# Patient Record
Sex: Male | Born: 1935 | Race: White | Hispanic: No
Health system: Southern US, Community
[De-identification: ages and names within clinical notes are randomized; demographics above are authoritative.]

## PROBLEM LIST (undated history)

## (undated) DIAGNOSIS — F0390 Unspecified dementia without behavioral disturbance: Secondary | ICD-10-CM

## (undated) DIAGNOSIS — I10 Essential (primary) hypertension: Secondary | ICD-10-CM

## (undated) DIAGNOSIS — E785 Hyperlipidemia, unspecified: Secondary | ICD-10-CM

## (undated) DIAGNOSIS — Z8673 Personal history of transient ischemic attack (TIA), and cerebral infarction without residual deficits: Secondary | ICD-10-CM

## (undated) HISTORY — PX: CATARACT EXTRACTION W/ INTRAOCULAR LENS IMPLANT: SHX1309

## (undated) HISTORY — PX: TRANSURETHRAL RESECTION OF PROSTATE: SHX73

---

## 1998-03-09 ENCOUNTER — Ambulatory Visit (HOSPITAL_COMMUNITY): Admission: RE | Admit: 1998-03-09 | Discharge: 1998-03-09 | Payer: Self-pay | Admitting: Specialist

## 1998-05-27 ENCOUNTER — Ambulatory Visit (HOSPITAL_COMMUNITY): Admission: RE | Admit: 1998-05-27 | Discharge: 1998-05-27 | Payer: Self-pay | Admitting: Specialist

## 2001-05-02 ENCOUNTER — Encounter: Payer: Self-pay | Admitting: Emergency Medicine

## 2001-05-02 ENCOUNTER — Emergency Department (HOSPITAL_COMMUNITY): Admission: EM | Admit: 2001-05-02 | Discharge: 2001-05-02 | Payer: Self-pay | Admitting: Emergency Medicine

## 2001-07-20 ENCOUNTER — Encounter: Admission: RE | Admit: 2001-07-20 | Discharge: 2001-10-18 | Payer: Self-pay | Admitting: Internal Medicine

## 2001-09-18 ENCOUNTER — Encounter: Admission: RE | Admit: 2001-09-18 | Discharge: 2001-12-17 | Payer: Self-pay | Admitting: Internal Medicine

## 2001-10-09 ENCOUNTER — Encounter: Admission: RE | Admit: 2001-10-09 | Discharge: 2001-11-08 | Payer: Self-pay | Admitting: Internal Medicine

## 2004-01-24 ENCOUNTER — Observation Stay (HOSPITAL_COMMUNITY): Admission: EM | Admit: 2004-01-24 | Discharge: 2004-01-25 | Payer: Self-pay | Admitting: Emergency Medicine

## 2004-03-19 ENCOUNTER — Emergency Department (HOSPITAL_COMMUNITY): Admission: EM | Admit: 2004-03-19 | Discharge: 2004-03-19 | Payer: Self-pay | Admitting: *Deleted

## 2005-02-11 ENCOUNTER — Encounter (INDEPENDENT_AMBULATORY_CARE_PROVIDER_SITE_OTHER): Payer: Self-pay | Admitting: Specialist

## 2005-02-11 ENCOUNTER — Inpatient Hospital Stay (HOSPITAL_COMMUNITY): Admission: RE | Admit: 2005-02-11 | Discharge: 2005-02-14 | Payer: Self-pay | Admitting: Urology

## 2008-12-24 ENCOUNTER — Emergency Department (HOSPITAL_COMMUNITY): Admission: EM | Admit: 2008-12-24 | Discharge: 2008-12-24 | Payer: Self-pay | Admitting: Emergency Medicine

## 2010-12-18 ENCOUNTER — Emergency Department (HOSPITAL_COMMUNITY): Admission: EM | Admit: 2010-12-18 | Discharge: 2010-12-18 | Source: Home / Self Care | Admitting: Emergency Medicine

## 2010-12-18 LAB — DIFFERENTIAL
Basophils Relative: 0 % (ref 0–1)
Eosinophils Relative: 2 % (ref 0–5)
Lymphocytes Relative: 30 % (ref 12–46)
Lymphs Abs: 2 10*3/uL (ref 0.7–4.0)
Neutro Abs: 3.8 10*3/uL (ref 1.7–7.7)

## 2010-12-18 LAB — CBC
HCT: 34 % — ABNORMAL LOW (ref 39.0–52.0)
MCH: 30.1 pg (ref 26.0–34.0)
MCV: 90.4 fL (ref 78.0–100.0)
Platelets: 228 10*3/uL (ref 150–400)
RBC: 3.76 MIL/uL — ABNORMAL LOW (ref 4.22–5.81)
WBC: 6.7 10*3/uL (ref 4.0–10.5)

## 2010-12-18 LAB — BASIC METABOLIC PANEL
BUN: 32 mg/dL — ABNORMAL HIGH (ref 6–23)
CO2: 27 mEq/L (ref 19–32)
Calcium: 9.1 mg/dL (ref 8.4–10.5)
Creatinine, Ser: 1.64 mg/dL — ABNORMAL HIGH (ref 0.4–1.5)
GFR calc non Af Amer: 40 mL/min — ABNORMAL LOW (ref >60)
Glucose, Bld: 177 mg/dL — ABNORMAL HIGH (ref 70–99)
Sodium: 135 mEq/L (ref 135–145)

## 2011-03-08 LAB — URINALYSIS, ROUTINE W REFLEX MICROSCOPIC
Bilirubin Urine: NEGATIVE
Nitrite: NEGATIVE
Specific Gravity, Urine: 1.016 (ref 1.005–1.030)
pH: 7 (ref 5.0–8.0)

## 2011-03-08 LAB — COMPREHENSIVE METABOLIC PANEL
AST: 31 U/L (ref 0–37)
Albumin: 4.5 g/dL (ref 3.5–5.2)
Alkaline Phosphatase: 123 U/L — ABNORMAL HIGH (ref 39–117)
Chloride: 102 mEq/L (ref 96–112)
Potassium: 3.7 mEq/L (ref 3.5–5.1)
Sodium: 140 mEq/L (ref 135–145)
Total Bilirubin: 1 mg/dL (ref 0.3–1.2)

## 2011-03-08 LAB — DIFFERENTIAL
Eosinophils Absolute: 0 10*3/uL (ref 0.0–0.7)
Eosinophils Relative: 1 % (ref 0–5)
Lymphs Abs: 1.6 10*3/uL (ref 0.7–4.0)
Monocytes Absolute: 0.6 10*3/uL (ref 0.1–1.0)
Monocytes Relative: 10 % (ref 3–12)

## 2011-03-08 LAB — CBC
MCHC: 32.7 g/dL (ref 30.0–36.0)
RBC: 4.33 MIL/uL (ref 4.22–5.81)
RDW: 14.1 % (ref 11.5–15.5)

## 2011-04-08 NOTE — Op Note (Signed)
NAME:  KERON, NEENAN NO.:  [id number]   MEDICAL RECORD NO.:  [id number]          PATIENT TYPE:  INP   LOCATION:  0358                         FACILITY:  [facility]   PHYSICIAN:  Lindaann Slough, M.D.  DATE OF BIRTH:  19-Sep-1919   DATE OF PROCEDURE:  02/11/2005  DATE OF DISCHARGE:                                 OPERATIVE REPORT   PREOPERATIVE DIAGNOSES:  1.  Urinary retention.  2.  Benign prostatic hypertrophy.   PROCEDURE PERFORMED:  1.  Cystoscopy.  2.  Transurethral resection of the prostate.  3.  Placement of a suprapubic tube (Microvasive).   ATTENDING SURGEON:  Lindaann Slough, M.D.   RESIDENT SURGEON:  Thyra Breed, M.D.   ANESTHESIA:  General endotracheal.   ESTIMATED BLOOD LOSS:  Minimal.   DRAINS:  1.  A 24 French three-way Foley catheter.  2.  Microvasive suprapubic tube.   COMPLICATIONS:  None.   INDICATIONS FOR PROCEDURE:  Mr. Medlen is a pleasant 77 year old male  followed for significant lower urinary tract symptoms, including frequency,  urgency, as well as urge incontinence.  In the office, he had a post-void  residual of 870 ml.  The patient has been left with an indwelling Foley  catheter since early March, 2006.  He was also started on Flomax.  Office  cystoscopy showed significant trilobar prostatic hypertrophy.  His past  medical history is significant for diabetes mellitus, nephrolithiasis, as  well as a stroke two years earlier.  Given his significant lower urinary  tract symptoms as well as his urinary retention, we have recommended that he  undergo transurethral resection of his prostate.  Mr. Hartshorn understands  all of the risks, benefits and alternatives of the procedure in detail,  including but not limited to, bleeding, infection, damage to adjacent  structures, postoperative incontinence, failure of the procedure and need  for reoperation as well as the risks of anesthesia.  Informed consent is  obtained.   PROCEDURE IN  DETAIL:  Following identification by his arm bracelet, the  patient was brought to the operating room and placed in a supine position.  Here, he underwent successful induction of general endotracheal anesthesia  and received preoperative IV antibiotics.  He was then moved to the dorsal  lithotomy position.  All pressure points were padded appropriately to avoid  compartment syndrome.  His lower abdomen was then shaved.  His entire  perineum and genitalia as well as lower abdomen were then prepped with  Betadine and draped in the usual sterile fashion.  We began by placing a 12  degree cystoscopic lens through a 22.5 French sheath transurethrally, which  revealed a normal-appearing anterior urethra.  Upon entry into the posterior  urethra, there was significant trilobar hypertrophy, more so on the lateral  lobes.  Entry into the bladder showed 2+ trabeculation.  Both the right and  the left ureteral orifices were easily identified in their normal anatomic  locations.  Further inspection of the bladder showed some bullous edema of  the posterior bladder mucosa as well as some erythema consistent with the  indwelling Foley catheter.  There  was no evidence of tumors or stones within  the bladder.  Sissy Hoff sounds were then used to dilate the urethra to 30  [place].  The resectoscope sheath was then placed with the obturator.  Continuous flow using glycine was then used, as the resectoscope loop was  placed in the scope.  We began by resecting the median lobe from the bladder  neck to the level of the verumontanum.  We then resected first the right  lateral lobe to the level of the capsule and then the left lateral lobe.  There was a small amount of tissue that was resected at the 12 o'clock  location.  Overall resection time was about 50 minutes.  We then fulgurated  any bleeding within the prostatic urethra.  On the right lateral lobe, the  resection was taken to the level of the capsule, and  there was some venous  sinus bleeding.  This was fulgurated; however, persistent bleeding from the  sinus continued.  We would later further tamponade this bleeding with the  Foley catheter.  The bladder was then irrigated free of any prostatic chips.  One final look with the scope was taken in the bladder, and there were no  remaining chips.  We then replaced the scope and filled the bladder to  capacity.  A small stab incision was made approximately two fingerbreadths  above the pubic symphysis overlying the bladder.  The Microvasive suprapubic  apparatus was then used to puncture into the bladder until there was return  of urine.  The needle was removed, and the suprapubic tube set into the  bladder, which was seen visually cystoscopically.  The distal coil was  placed in the tube, and the apparatus locked.  The suprapubic catheter  irrigated freely.  We then removed the resectoscope.  A 24 French three-way  Foley catheter was inserted, and 60 cc of irrigant used to fill the balloon.  Keeping gentle traction against the bladder neck, the bladder was irrigated  free of any blood, and the irrigant was clear.  The suprapubic tube was  kept.  A Foley catheter was connected to straight drainage.  The patient  tolerated the procedure well, and there were no complications.  Dr. Brunilda Payor was  present and participated in the entire procedure, as he was the responsible  surgeon.   DISPOSITION:  After awakening from general anesthesia, the patient was  transported to the post anesthesia care unit in stable condition.  He was  given 20 mg of Lasix IV following the procedure.  He will be transferred to  the floor for overnight observation and probable removal of his catheter the  following morning followed by a voiding trial.   A urine culture from March 21 grew out methicillin-resistant staphylococcus  aureus, which was sensitive to both Bactrim and gentamicin.  Mr. Stubblefield was placed on Bactrim and  gentamicin postoperatively.      EG/MEDQ  D:  02/11/2005  T:  02/11/2005  Job:  [id number]   cc:   Olene Craven, M.D.  [address]  [address]  [place]  [place] [address]  Fax: [phone]

## 2011-04-08 NOTE — H&P (Signed)
NAME:  [name], [name] NO.:  [id number]   MEDICAL RECORD NO.:  [id number]                   PATIENT TYPE:  EMS   LOCATION:  MAJO                                 FACILITY:  [facility]   PHYSICIAN:  Ralene Ok, M.D.                   DATE OF BIRTH:  20-Jan-1919   DATE OF ADMISSION:  01/24/2004  DATE OF DISCHARGE:                                HISTORY & PHYSICAL   CHIEF COMPLAINT:  Fall at home.   This 77 year old white male, who lives by himself, said he rolled off his  bed and was unable to get off the floor for several hours as he was wedged  between two beds.  The patient thinks he dreamt that he was falling and then  found himself on the floor.  No history of previous falls recently.   PAST MEDICAL HISTORY:  1. CVA about one and one-half years ago involving the left side from which     he has recovered reasonably.  2. Type 2 diabetes mellitus.   SOCIAL HISTORY:  The patient is a widower and lives alone.  Does not drink  or smoke.   REVIEW OF SYSTEMS:  The patient does admit to some pain in his left elbow  and back area and discomfort of his left shoulder area and right knee.   MEDICATIONS:  1. Lisinopril, he thinks 10 mg daily.  2. Norvasc 5 mg daily.  3. Toprol XL 12.5 mg daily.  4. Actos 30 mg.  5. Metformin 1000 mg twice a day for diabetes.  6. Aspirin.  7. Multivitamin tablet.   ALLERGIES:  No known drug allergies.   PHYSICAL EXAMINATION:  GENERAL:  The patient is alert and oriented x 3.  There is no facial asymmetry noted, and patient is able to eat and drink  without difficulty.  He does have a large reddish bruise on his left arm  with some abrasions on the left elbow and knees.  VITAL SIGNS:  Heart rate 90 per minute and regular, blood pressure 130/70.  CHEST:  Clinically clear to auscultation.  ABDOMEN:  Soft and nontender.  Bowel sounds present.  NEUROLOGIC:  Slight weakness on the left side compared to the right.  This  is  thought to be old.   LABORATORY AND X-RAY DATA:  STAT labs done shows a creatinine of 1.2;  however, his CK level was elevated at 1569.   IMPRESSION:  1. Mild rhabdomyolysis secondary his fall.  2. Diabetes mellitus, type 2.  3. Status post cerebrovascular accident with left hemiparesis.  4. The patient lives on his own.   PLAN:  In view of the mild rhabdomyolysis, will place the patient on IV  normal saline at 75 ml an hour and give him 20 mg of Lasix for diuresis.  We  will hold his metformin, Lipitor, and continue him on all his medications.  Will  repeat CK level in eight hours.  If this is improving, will plan to  discharge the patient back home.                                                Ralene Ok, M.D.    RM/MEDQ  D:  01/24/2004  T:  01/25/2004  Job:  [id number]

## 2011-04-08 NOTE — H&P (Signed)
NAME:  [name], [name] NO.:  [id number]   MEDICAL RECORD NO.:  [id number]          PATIENT TYPE:  INP   LOCATION:  0002                         FACILITY:  [facility]   PHYSICIAN:  Lindaann Slough, M.D.  DATE OF BIRTH:  June 20, 1919   DATE OF ADMISSION:  02/11/2005  DATE OF DISCHARGE:                                HISTORY & PHYSICAL   CHIEF COMPLAINT:  Urinary retention.   HISTORY OF PRESENT ILLNESS:  The patient is an 77 year old male who had been  complaining of frequency, urgency, urgency-incontinence, nocturia X2 for  several weeks.  He was seen in the office on January 25, 2005 and he was found  to have a post void residual of 870 cc.  A Foley catheter was then left  indwelling and the patient was started on Flomax.  He was given voiding  trial and he was still unable to void.  Cystoscopy showed trilobar prostatic  hypertrophy and Foley catheter was reinserted in bladder.  The patient was  started on Cipro.  He is now admitted for transurethral resection of  prostate.   PAST MEDICAL HISTORY:  Positive for diabetes, kidney stone and stroke 2  years ago.   MEDICATIONS:  1.  Toprol XL 25 mg twice a day.  2.  Norvasc 10 mg daily.  3.  Metformin 1,000 mg two tablets twice a day.  4.  Actos 40 mg a day.  5.  Lipitor 20 mg.  6.  Aspirin.   ALLERGIES:  No known drug allergies.   PAST SURGICAL HISTORY:  Cholecystectomy in 1963 and right eye implant 2000.   SOCIAL HISTORY:  He is a widower and has one son. He does not smoke nor  drink.   FAMILY HISTORY:  Father and mother died of unknown causes to him.  He has  one sister and he had two brothers, both deceased.   REVIEW OF SYMPTOMS:  PULMONARY:  He has no cough, no shortness of breath, no  hemoptysis.  CARDIOVASCULAR:  No palpitations, no chest pain.  GASTROINTESTINAL:  No nausea, vomiting, diarrhea or constipation.  GENITOURINARY:  As per history.   PHYSICAL EXAMINATION:  GENERAL APPEARANCE:  This is a  well-built 77 years  old male in no acute distress.  VITAL SIGNS:  Blood pressure 156/80, pulse 85, respirations 16, temperature  97.  HEENT: Head is normal.  Pupils are equal and reactive to light and  accommodation.  Ears, nose and throat are within normal limits.  NECK:  Supple.  No cervical lymph nodes. No thyromegaly.  CHEST:  Symmetrical.  LUNGS:  Fully expanded and clear to auscultation and percussion.  HEART:  Regular rhythm.  No murmurs, no gallops.  ABDOMEN:  Soft and nondistended, nontender.  He has no costovertebral angle  tenderness.  Kidneys are not palpable.  He has no hepatomegaly and no  splenomegaly.  No inguinal hernia.  Bowel sounds normal.  GENITOURINARY:  Penis is circumcised and meatus is normal.  Scrotum is  normal.  He has no testicular masses.  Cords and epididymides are within  normal limits.  On rectal examination sphincter  tone is normal.  Prostate is  enlarged, 40 grams.  No nodules.  Seminal vesicles not palpable.  EXTREMITIES:  Are within normal limits.   IMPRESSION:  1.  Urinary retention.  2.  Benign prostatic hypertrophy.  3.  Diabetes.      MN/MEDQ  D:  02/11/2005  T:  02/11/2005  Job:  [id number]

## 2011-04-08 NOTE — Discharge Summary (Signed)
NAME:  [name], [name] NO.:  [id number]   MEDICAL RECORD NO.:  [id number]          PATIENT TYPE:  INP   LOCATION:  0358                         FACILITY:  [facility]   PHYSICIAN:  Lindaann Slough, M.D.  DATE OF BIRTH:  1919/03/13   DATE OF ADMISSION:  02/11/2005  DATE OF DISCHARGE:  02/14/2005                                 DISCHARGE SUMMARY   DISCHARGE DIAGNOSES:  1.  Urinary retention.  2.  Benign prostatic hypertrophy.  3.  Diabetes.  4.  Hypertension.   PROCEDURE:  Cystoscopy, TURP, and insertion of suprapubic catheter on February 11, 2005.   HISTORY OF PRESENT ILLNESS:  The patient is an 77 year old male who had been  complaining of frequency, urgency, urgency incontinence, bacteriuria x2 for  several weeks.  He was seen in the office on January 25, 2005, and was found to  have by bladder ultrasound a postvoid residual of 870 cc.  A Foley catheter  was then inserted in the bladder and left indwelling and he was started on  Flomax.  A week later, he was given a voiding trial, and he was still unable  to void.  Cystoscopy showed trilobar prostatic hypertrophy.  The Foley  catheter was then re-inserted in the bladder and the patient was admitted on  March 24 for TURP.   PHYSICAL EXAMINATION:  VITAL SIGNS:  Blood pressure 156/80, pulse 85,  respirations 16, temperature 97.  HEENT:  Head is normal.  Pupils equal and reactive to light and  accommodation.  Ears, nose, and throat within normal limits.  LUNGS:  Clear to percussion and auscultation.  HEART:  Regular rate.  ABDOMEN:  Soft, nondistended, nontender.  No CVA tenderness.  Kidneys not  palpable.  No hepatomegaly.  No splenomegaly.  Bowel sounds normal.  GENITALIA:  Penis is circumcised.  Meatus is normal.  Scrotum is normal.  Testicles, cords, and epididymis are within normal limits.  RECTAL:  Prostate is enlarged at 40 g, no nodules.  Seminal vesicles not  palpable.   LABORATORY DATA:  Hemoglobin on  admission was 10.1, hematocrit 29.9, WBC  8.8.  Sodium 134, potassium 4.5, BUN 20, creatinine 1.3.  Urinalysis showed  7-10 wbc's and many bacteria.  Urine culture showed more than 100,000  colonies, methicillin resistant Staphylococcus aureus that was resistant to  oxacillin, penicillin, and sensitive to trimethoprim sulfa and gentamicin.  Chest x-ray showed no evidence of active disease.  EKG showed normal sinus  rhythm and low voltage QRS.   HOSPITAL COURSE:  The patient had TURP on February 11, 2005.  He remained  afebrile.  The Foley catheter was removed on the first day postop but he was  unable to void after removal of the Foley catheter and the catheter was  reinserted and the catheter was removed on February 14, 2005.  After removing  the Foley, he was voiding a small amount of urine at a time and he had a  postvoid residual of 300 cc.  The patient remained afebrile and he was  discharged home on Bactrim DS 1 tablet twice a day and Glucophage  1000 mg  twice a day, Zocor 20 mg daily, Norvasc 10 mg daily, Actos 40 mg daily,  Toprol XL 50 mg daily.   DISCHARGE DIET:  Diabetic diet.   DISCHARGE INSTRUCTIONS:  The patient is instructed how to use the suprapubic  catheter to drain his bladder.  He was asked to write down the amount of  urine voided and the amount of residual urine from the suprapubic catheter.  He will be seen in the office in 1 week.  If the residual urine is minimal,  we will then remove the suprapubic catheter.   CONDITION ON DISCHARGE:  Improved.      MN/MEDQ  D:  02/14/2005  T:  02/14/2005  Job:  [id number]   cc:   Olene Craven, M.D.  [address]  [address]  [place]  [place] [address]  Fax: [phone]

## 2011-10-28 ENCOUNTER — Emergency Department (HOSPITAL_COMMUNITY): Payer: Medicare Other

## 2011-10-28 ENCOUNTER — Other Ambulatory Visit: Payer: Self-pay

## 2011-10-28 ENCOUNTER — Encounter: Payer: Self-pay | Admitting: Emergency Medicine

## 2011-10-28 ENCOUNTER — Inpatient Hospital Stay (HOSPITAL_COMMUNITY)
Admission: EM | Admit: 2011-10-28 | Discharge: 2011-11-04 | DRG: 536 | Disposition: A | Discharge status: Skilled Nursing Facility | Payer: Medicare Other | Attending: Internal Medicine | Admitting: Internal Medicine

## 2011-10-28 DIAGNOSIS — S32509A Unspecified fracture of unspecified pubis, initial encounter for closed fracture: Secondary | ICD-10-CM | POA: Diagnosis present

## 2011-10-28 DIAGNOSIS — S32409A Unspecified fracture of unspecified acetabulum, initial encounter for closed fracture: Principal | ICD-10-CM | POA: Diagnosis present

## 2011-10-28 DIAGNOSIS — Y92009 Unspecified place in unspecified non-institutional (private) residence as the place of occurrence of the external cause: Secondary | ICD-10-CM

## 2011-10-28 DIAGNOSIS — E785 Hyperlipidemia, unspecified: Secondary | ICD-10-CM

## 2011-10-28 DIAGNOSIS — Z8673 Personal history of transient ischemic attack (TIA), and cerebral infarction without residual deficits: Secondary | ICD-10-CM

## 2011-10-28 DIAGNOSIS — I10 Essential (primary) hypertension: Secondary | ICD-10-CM

## 2011-10-28 DIAGNOSIS — S32401A Unspecified fracture of right acetabulum, initial encounter for closed fracture: Secondary | ICD-10-CM

## 2011-10-28 DIAGNOSIS — E876 Hypokalemia: Secondary | ICD-10-CM | POA: Diagnosis present

## 2011-10-28 DIAGNOSIS — W010XXA Fall on same level from slipping, tripping and stumbling without subsequent striking against object, initial encounter: Secondary | ICD-10-CM | POA: Diagnosis present

## 2011-10-28 DIAGNOSIS — E119 Type 2 diabetes mellitus without complications: Secondary | ICD-10-CM

## 2011-10-28 DIAGNOSIS — Z66 Do not resuscitate: Secondary | ICD-10-CM | POA: Diagnosis present

## 2011-10-28 HISTORY — DX: Personal history of transient ischemic attack (TIA), and cerebral infarction without residual deficits: Z86.73

## 2011-10-28 HISTORY — DX: Essential (primary) hypertension: I10

## 2011-10-28 HISTORY — DX: Hyperlipidemia, unspecified: E78.5

## 2011-10-28 LAB — URINALYSIS, ROUTINE W REFLEX MICROSCOPIC
Hgb urine dipstick: NEGATIVE
Nitrite: NEGATIVE
Specific Gravity, Urine: 1.01 (ref 1.005–1.030)
Urobilinogen, UA: 1 mg/dL (ref 0.0–1.0)
pH: 7.5 (ref 5.0–8.0)

## 2011-10-28 LAB — BASIC METABOLIC PANEL
BUN: 29 mg/dL — ABNORMAL HIGH (ref 6–23)
Chloride: 101 mEq/L (ref 96–112)
GFR calc non Af Amer: 48 mL/min — ABNORMAL LOW (ref >90)
Glucose, Bld: 109 mg/dL — ABNORMAL HIGH (ref 70–99)
Potassium: 4.6 mEq/L (ref 3.5–5.1)
Sodium: 135 mEq/L (ref 135–145)

## 2011-10-28 LAB — CBC
HCT: 33.8 % — ABNORMAL LOW (ref 39.0–52.0)
MCHC: 33.4 g/dL (ref 30.0–36.0)

## 2011-10-28 MED ORDER — FENTANYL CITRATE 0.05 MG/ML IJ SOLN
50.0000 ug | INTRAMUSCULAR | Status: DC | PRN
  Administered 2011-10-28: 50 ug via INTRAVENOUS
  Filled 2011-10-28: qty 2

## 2011-10-28 NOTE — ED Provider Notes (Addendum)
History     CSN: [id number] Arrival date & time: 10/28/2011  6:47 PM   First MD Initiated Contact with Patient 10/28/11 1857      Chief Complaint  Patient presents with  . Fall    no LOC  . Hip Pain    right sided    (Consider location/radiation/quality/duration/timing/severity/associated sxs/prior treatment) Patient is a 77 y.o. male presenting with hip pain. The history is provided by the patient and a relative.  Hip Pain   the patient reports falling after losing his balance at home this evening.  He fell onto his right side.  His abrasions to both of his elbows and severe pain in his right hip.  He's been unable to ambulate since given the pain in his right hip.  He denies weakness or numbness in his upper lower extremities.  He denies head injury.  He denies neck pain.  He denies loss consciousness.  No recent nausea vomiting or diarrhea.  He is otherwise been normal over the past several days.  He reports this all occurred after tripping in the kitchen making dinner.  His pain is worsened by movement.  Improved by nothing.  His pain is moderate to severe in severity  Past Medical History  Diagnosis Date  . Hyperlipemia   . Diabetes mellitus   . Stroke     affected left side    No past surgical history on file.  No family history on file.  History  Substance Use Topics  . Smoking status: Not on file  . Smokeless tobacco: Not on file  . Alcohol Use:       Review of Systems  All other systems reviewed and are negative.    Allergies  Review of patient's allergies indicates no known allergies.  Home Medications   Current Outpatient Rx  Name Route Sig Dispense Refill  . AMLODIPINE BESYLATE 10 MG PO TABS Oral Take 10 mg by mouth daily.      . ASPIRIN 325 MG PO TABS Oral Take 325 mg by mouth daily.      Marland Kitchen HYDROCHLOROTHIAZIDE 12.5 MG PO CAPS Oral Take 12.5 mg by mouth daily.      Marland Kitchen METFORMIN HCL 1000 MG PO TABS Oral Take 1,000 mg by mouth 2 (two) times daily  with a meal.      . PIOGLITAZONE HCL 15 MG PO TABS Oral Take 15 mg by mouth daily.        BP 108/78  Pulse 100  Physical Exam  Nursing note and vitals reviewed. Constitutional: He is oriented to person, place, and time. He appears well-developed and well-nourished.  HENT:  Head: Normocephalic and atraumatic.  Eyes: EOM are normal.  Neck: Normal range of motion. Neck supple.       The cervical spine tenderness  Cardiovascular: Normal rate, regular rhythm, normal heart sounds and intact distal pulses.   Pulmonary/Chest: Effort normal and breath sounds normal. No respiratory distress.  Abdominal: Soft. He exhibits no distension. There is no tenderness.  Neurological: He is alert and oriented to person, place, and time.  Skin: Skin is warm and dry.  Psychiatric: He has a normal mood and affect. Judgment normal.    ED Course  Procedures (including critical care time)   Date: 10/29/2011  Rate: 75  Rhythm: normal sinus rhythm  QRS Axis: normal  Intervals: normal  ST/T Wave abnormalities: normal  Conduction Disutrbances:none  Narrative Interpretation:   Old EKG Reviewed: No significant changes noted  Labs Reviewed  CBC - Abnormal; Notable for the following:    RBC 3.70 (*)    Hemoglobin 11.3 (*)    HCT 33.8 (*)    All other components within normal limits  BASIC METABOLIC PANEL - Abnormal; Notable for the following:    Glucose, Bld 109 (*)    BUN 29 (*)    GFR calc non Af Amer 48 (*)    GFR calc Af Amer 55 (*)    All other components within normal limits  URINALYSIS, ROUTINE W REFLEX MICROSCOPIC  TROPONIN I  URINE CULTURE   Dg Chest 1 View  10/28/2011  *RADIOLOGY REPORT*  Clinical Data: Preop hip fracture  CHEST - 1 VIEW  Comparison: 02/08/2005  Findings: Chronic interstitial markings. No pleural effusion or pneumothorax.  Cardiomediastinal silhouette is within normal limits.  Degenerative changes of the visualized thoracolumbar spine.  IMPRESSION: No evidence of  acute cardiopulmonary disease.  Original Report Authenticated By: Charline Bills, M.D.   Dg Hip Complete Right  10/28/2011  *RADIOLOGY REPORT*  Clinical Data: Larey Seat and injured right hip.  RIGHT HIP - COMPLETE 2+ VIEW 10/28/2011:  Comparison: None.  Findings: Fracture involving the right acetabulum, extending into the right superior pubic ramus.  No fractures involving the femoral head or neck.  Severe axial joint space narrowing.  Included AP pelvis demonstrate no fractures elsewhere.  Severe axial joint space narrowing in the contralateral left hip. Sacroiliac joints and symphysis pubis intact.  Severe degenerative changes throughout the visualized lower lumbar spine.  Severe generalized osteopenia.  IMPRESSION: Fracture involving the right acetabulum with extension to the right superior pubic ramus.  Severe osteopenia.  Severe osteoarthritis in both hips.  Original Report Authenticated By: Arnell Sieving, M.D.     1. Closed right acetabular fracture       MDM  Concern for right hip fracture.  Labs and x-rays obtained.  EKG pending.  N.p.o. at this time  11:21 PM [facility] to admit. Spoke with Dr Charlann Boxer, who will evaluate the pt in the hospital. He suspects nonoperative treatment based on my description        Lyanne Co, MD 10/28/11 2324  Lyanne Co, MD 10/29/11 [phone]

## 2011-10-28 NOTE — ED Notes (Signed)
[id number]<NG> Expected date:<BR> Expected time:<BR> Means of arrival:<BR> Comments:<BR> Ems/hip pain fall

## 2011-10-28 NOTE — ED Notes (Signed)
Pt (who uses a walker) states that he was attempting to put a dish from the top of the dishwasher into the microwave.  He had the dish in one hand and was trying to hold onto the walker with the other hand.  He lost his balance and "sat down hard" on his bottom and fell back onto his elbows.  Pt denies ever losing consciousness and denies hitting head.  C/o pain on right hip 5/10.  Skin tears to both elbows which were wrapped by ems.

## 2011-10-28 NOTE — ED Notes (Signed)
Per EMS: Pt lost his balance at home and fell onto his right side.  Is c/o hip pain (mainly when standing).  No LOC.  Denies hitting head.  No thinners.  Skin tears noted to both elbows.  Wrapped by EMS.

## 2011-10-29 ENCOUNTER — Encounter (HOSPITAL_COMMUNITY): Payer: Self-pay | Admitting: Family Medicine

## 2011-10-29 DIAGNOSIS — E785 Hyperlipidemia, unspecified: Secondary | ICD-10-CM

## 2011-10-29 DIAGNOSIS — I10 Essential (primary) hypertension: Secondary | ICD-10-CM

## 2011-10-29 DIAGNOSIS — S32409A Unspecified fracture of unspecified acetabulum, initial encounter for closed fracture: Secondary | ICD-10-CM | POA: Diagnosis present

## 2011-10-29 DIAGNOSIS — E119 Type 2 diabetes mellitus without complications: Secondary | ICD-10-CM

## 2011-10-29 LAB — CBC
Hemoglobin: 10.3 g/dL — ABNORMAL LOW (ref 13.0–17.0)
MCH: 30.5 pg (ref 26.0–34.0)
Platelets: 239 10*3/uL (ref 150–400)
RBC: 3.38 MIL/uL — ABNORMAL LOW (ref 4.22–5.81)
WBC: 6.4 10*3/uL (ref 4.0–10.5)

## 2011-10-29 LAB — CREATININE, SERUM
Creatinine, Ser: 1.29 mg/dL (ref 0.50–1.35)
GFR calc Af Amer: 55 mL/min — ABNORMAL LOW (ref >90)
GFR calc non Af Amer: 47 mL/min — ABNORMAL LOW (ref >90)

## 2011-10-29 LAB — GLUCOSE, CAPILLARY
Glucose-Capillary: 92 mg/dL (ref 70–99)
Glucose-Capillary: 125 mg/dL — ABNORMAL HIGH (ref 70–99)

## 2011-10-29 LAB — MRSA PCR SCREENING: MRSA by PCR: POSITIVE — AB

## 2011-10-29 MED ORDER — INSULIN ASPART 100 UNIT/ML ~~LOC~~ SOLN
0.0000 [IU] | Freq: Three times a day (TID) | SUBCUTANEOUS | Status: DC
  Administered 2011-10-29: 2 [IU] via SUBCUTANEOUS
  Administered 2011-10-29: 3 [IU] via SUBCUTANEOUS
  Administered 2011-10-30 – 2011-11-03 (×6): 2 [IU] via SUBCUTANEOUS
  Filled 2011-10-29: qty 3

## 2011-10-29 MED ORDER — INFLUENZA VIRUS VACC SPLIT PF IM SUSP
0.5000 mL | INTRAMUSCULAR | Status: AC
  Administered 2011-10-29: 0.5 mL via INTRAMUSCULAR
  Filled 2011-10-29: qty 0.5

## 2011-10-29 MED ORDER — SODIUM CHLORIDE 0.9 % IJ SOLN: 3.0000 mL | INTRAMUSCULAR | Status: DC | PRN

## 2011-10-29 MED ORDER — ONDANSETRON HCL 4 MG PO TABS: 4.0000 mg | ORAL_TABLET | Freq: Four times a day (QID) | ORAL | Status: DC | PRN

## 2011-10-29 MED ORDER — ALUM & MAG HYDROXIDE-SIMETH 200-200-20 MG/5ML PO SUSP: 30.0000 mL | Freq: Four times a day (QID) | ORAL | Status: DC | PRN

## 2011-10-29 MED ORDER — ZOLPIDEM TARTRATE 5 MG PO TABS
5.0000 mg | ORAL_TABLET | Freq: Every evening | ORAL | Status: DC | PRN
  Administered 2011-11-03: 5 mg via ORAL
  Filled 2011-10-29: qty 1

## 2011-10-29 MED ORDER — PIOGLITAZONE HCL 30 MG PO TABS
30.0000 mg | ORAL_TABLET | Freq: Every day | ORAL | Status: DC
  Administered 2011-10-29 – 2011-11-03 (×6): 30 mg via ORAL
  Administered 2011-11-03: 15 mg via ORAL
  Filled 2011-10-29 (×7): qty 1

## 2011-10-29 MED ORDER — OXYCODONE HCL 5 MG PO TABS
5.0000 mg | ORAL_TABLET | ORAL | Status: DC | PRN
  Administered 2011-10-31 – 2011-11-04 (×5): 5 mg via ORAL
  Filled 2011-10-29 (×5): qty 1

## 2011-10-29 MED ORDER — MORPHINE SULFATE 2 MG/ML IJ SOLN
2.0000 mg | INTRAMUSCULAR | Status: DC | PRN
  Administered 2011-10-29 (×2): 2 mg via INTRAVENOUS
  Filled 2011-10-29 (×2): qty 1

## 2011-10-29 MED ORDER — ACETAMINOPHEN 650 MG RE SUPP: 650.0000 mg | Freq: Four times a day (QID) | RECTAL | Status: DC | PRN

## 2011-10-29 MED ORDER — ONDANSETRON HCL 4 MG/2ML IJ SOLN: 4.0000 mg | Freq: Four times a day (QID) | INTRAMUSCULAR | Status: DC | PRN

## 2011-10-29 MED ORDER — ENOXAPARIN SODIUM 40 MG/0.4ML ~~LOC~~ SOLN
40.0000 mg | Freq: Every day | SUBCUTANEOUS | Status: DC
  Administered 2011-10-29 – 2011-11-02 (×5): 40 mg via SUBCUTANEOUS
  Filled 2011-10-29 (×5): qty 0.4

## 2011-10-29 MED ORDER — AMLODIPINE BESYLATE 10 MG PO TABS
10.0000 mg | ORAL_TABLET | Freq: Every day | ORAL | Status: DC
  Administered 2011-10-29 – 2011-11-04 (×7): 10 mg via ORAL
  Filled 2011-10-29 (×7): qty 1

## 2011-10-29 MED ORDER — ACETAMINOPHEN 325 MG PO TABS: 650.0000 mg | ORAL_TABLET | Freq: Four times a day (QID) | ORAL | Status: DC | PRN

## 2011-10-29 MED ORDER — INSULIN ASPART 100 UNIT/ML ~~LOC~~ SOLN: 0.0000 [IU] | Freq: Every day | SUBCUTANEOUS | Status: DC

## 2011-10-29 NOTE — Progress Notes (Signed)
Patient seen, no surgery due to high risk. Family interested in palliative care consult for goals of care. Will get palliative care consult.

## 2011-10-29 NOTE — H&P (Signed)
PCP:   Tried internal medicine  Chief Complaint:  fall  HPI: This is a rather pleasant 77 year old gentleman who was in the kitchen today, he turned to place something on the counter and lost his balance and fell. He fell squarely on his buttock, he had no loss of consciousness, he did not hit his head. He called his son, as he was unable to get up. His son called 911, he was brought to the ER. Reports her chest pains, no dizziness. Patient had a mechanical fall.  Review of Systems: Positives bolded The patient denies anorexia, fever, weight loss,, vision loss, decreased hearing, hoarseness, chest pain, syncope, dyspnea on exertion, peripheral edema, balance deficits, hemoptysis, abdominal pain, melena, hematochezia, severe indigestion/heartburn, hematuria, incontinence, genital sores, muscle weakness, suspicious skin lesions, transient blindness, difficulty walking, depression, unusual weight change, abnormal bleeding, enlarged lymph nodes, angioedema, and breast masses.  Past Medical History: Past Medical History  Diagnosis Date  . Hyperlipemia   . Diabetes mellitus   . H/O: CVA (cardiovascular accident)     affected left side  . HTN (hypertension)    Past Surgical History  Procedure Date  . Transurethral resection of prostate   . Cataract extraction w/ intraocular lens implant     Medications: Prior to Admission medications   Medication Sig Start Date End Date Taking? Authorizing Provider  amLODipine (NORVASC) 10 MG tablet Take 10 mg by mouth daily.     Yes Historical Provider, MD  aspirin 325 MG tablet Take 325 mg by mouth daily.     Yes Historical Provider, MD  atorvastatin (LIPITOR) 10 MG tablet Take 10 mg by mouth daily.     Yes Historical Provider, MD  fenofibrate micronized (LOFIBRA) 134 MG capsule Take 134 mg by mouth daily before breakfast.     Yes Historical Provider, MD  hydrochlorothiazide (MICROZIDE) 12.5 MG capsule Take 12.5 mg by mouth daily.     Yes Historical  Provider, MD  pioglitazone (ACTOS) 30 MG tablet Take 30 mg by mouth daily.     Yes Historical Provider, MD    Allergies:  No Known Allergies  Social History:  reports that he has never smoked. He does not have any smokeless tobacco history on file. He reports that he does not drink alcohol or use illicit drugs. lives alone, uses walker, no home oxygen. As the CNA for 6 days a week.; Her supportive son at bedside  Family History: None  Physical Exam: Filed Vitals:   10/28/11 1843 10/28/11 2302  BP: 108/78 148/64  Pulse: 100 77  Temp:  98.1 F (36.7 C)  TempSrc:  Oral  Resp:  20  SpO2:  100%    General:  Alert and oriented times three, well developed and nourished, no acute distress Eyes: PERRLA, pink conjunctiva, no scleral icterus ENT: Moist oral mucosa, neck supple, no thyromegaly Lungs: clear to ascultation, no wheeze, no crackles, no use of accessory muscles Cardiovascular: regular rate and rhythm, no regurgitation, no gallops, no murmurs. No carotid bruits, no JVD Abdomen: soft, positive BS, non-tender, non-distended, no organomegaly, not an acute abdomen GU: not examined Neuro: CN II - XII grossly intact, sensation intact Musculoskeletal: Lower extremity strength not assessed due to patient's discomfort, no clubbing, cyanosis or edema, abrasions on both elbows  Skin: no rash, no subcutaneous crepitation, no decubitus Psych: appropriate patient   Labs on Admission:   [facility] 10/28/11 2006  NA 135  K 4.6  CL 101  CO2 25  GLUCOSE 109*  BUN 29*  CREATININE 1.28  CALCIUM 9.2  MG --  PHOS --   No results found for this basename: AST:2,ALT:2,ALKPHOS:2,BILITOT:2,PROT:2,ALBUMIN:2 in the last 72 hours No results found for this basename: LIPASE:2,AMYLASE:2 in the last 72 hours  Basename 10/28/11 2006  WBC 6.5  NEUTROABS --  HGB 11.3*  HCT 33.8*  MCV 91.4  PLT 246    Basename 10/28/11 2006  CKTOTAL --  CKMB --  CKMBINDEX --  TROPONINI <0.30   No results  found for this basename: TSH,T4TOTAL,FREET3,T3FREE,THYROIDAB in the last 72 hours No results found for this basename: VITAMINB12:2,FOLATE:2,FERRITIN:2,TIBC:2,IRON:2,RETICCTPCT:2 in the last 72 hours  Radiological Exams on Admission: Dg Chest 1 View  10/28/2011  *RADIOLOGY REPORT*  Clinical Data: Preop hip fracture  CHEST - 1 VIEW  Comparison: 02/08/2005  Findings: Chronic interstitial markings. No pleural effusion or pneumothorax.  Cardiomediastinal silhouette is within normal limits.  Degenerative changes of the visualized thoracolumbar spine.  IMPRESSION: No evidence of acute cardiopulmonary disease.  Original Report Authenticated By: Charline Bills, M.D.   Dg Hip Complete Right  10/28/2011  *RADIOLOGY REPORT*  Clinical Data: Larey Seat and injured right hip.  RIGHT HIP - COMPLETE 2+ VIEW 10/28/2011:  Comparison: None.  Findings: Fracture involving the right acetabulum, extending into the right superior pubic ramus.  No fractures involving the femoral head or neck.  Severe axial joint space narrowing.  Included AP pelvis demonstrate no fractures elsewhere.  Severe axial joint space narrowing in the contralateral left hip. Sacroiliac joints and symphysis pubis intact.  Severe degenerative changes throughout the visualized lower lumbar spine.  Severe generalized osteopenia.  IMPRESSION: Fracture involving the right acetabulum with extension to the right superior pubic ramus.  Severe osteopenia.  Severe osteoarthritis in both hips.  Original Report Authenticated By: Arnell Sieving, M.D.    Assessment/Plan Present on Admission:  .Fracture acetabulum-closed Admit to MedSurg When necessary pain control Dr. Fransico Michael orthopedics is aware will see patient in a.m. Not a likely surgical candidate Diabetes mellitus Hypertension Dyslipidemia History of BPH History of CVA Stable Resume home meds  ADA diet and sliding scale insulin   Full code  DVT prophylaxis Team 6/Dr. Sharl Ma  Time in; 1145  p.m. Time out 12:18 a.m.;   Marcellino Fidalgo 10/29/2011, 12:11 AM

## 2011-10-30 LAB — CBC
Hemoglobin: 9.2 g/dL — ABNORMAL LOW (ref 13.0–17.0)
MCH: 30.6 pg (ref 26.0–34.0)
Platelets: 213 10*3/uL (ref 150–400)
RBC: 3.01 MIL/uL — ABNORMAL LOW (ref 4.22–5.81)
WBC: 6.4 10*3/uL (ref 4.0–10.5)

## 2011-10-30 LAB — BASIC METABOLIC PANEL
Calcium: 8.7 mg/dL (ref 8.4–10.5)
GFR calc Af Amer: 48 mL/min — ABNORMAL LOW (ref >90)
GFR calc non Af Amer: 41 mL/min — ABNORMAL LOW (ref >90)
Potassium: 3.4 mEq/L — ABNORMAL LOW (ref 3.5–5.1)
Sodium: 136 mEq/L (ref 135–145)

## 2011-10-30 LAB — GLUCOSE, CAPILLARY
Glucose-Capillary: 92 mg/dL (ref 70–99)
Glucose-Capillary: 145 mg/dL — ABNORMAL HIGH (ref 70–99)

## 2011-10-30 LAB — URINE CULTURE: Culture  Setup Time: 201212080153

## 2011-10-30 NOTE — Progress Notes (Signed)
Subjective: Patient seen, complains of pain in hip only at movement. Palliative care to meet on Monday.  Objective: Vital signs in last 24 hours: Temp:  [97.8 F (36.6 C)-98.9 F (37.2 C)] 97.8 F (36.6 C) (12/09 0734) Pulse Rate:  [86-99] 86  (12/09 0734) Resp:  [18] 18  (12/09 0734) BP: (120-132)/(55-69) 120/58 mmHg (12/09 1054) SpO2:  [95 %-97 %] 97 % (12/09 0734) Weight change:  Last BM Date: 10/27/11  Intake/Output from previous day: 12/08 0701 - 12/09 0700 In: 720 [P.O.:720] Out: 1850 [Urine:1850] Total I/O In: 240 [P.O.:240] Out: 500 [Urine:500]   Physical Exam: General: Alert, awake, oriented x3, in no acute distress. HEENT: No bruits, no goiter. Heart: Regular rate and rhythm, without murmurs, rubs, gallops. Lungs: Clear to auscultation bilaterally. Abdomen: Soft, nontender, nondistended, positive bowel sounds. Extremities: No clubbing cyanosis or edema with positive pedal pulses. Neuro: Grossly intact, nonfocal.    Lab Results: Basic Metabolic Panel:  Basename 10/30/11 0545 10/29/11 0650 10/28/11 2006  NA 136 -- 135  K 3.4* -- 4.6  CL 103 -- 101  CO2 24 -- 25  GLUCOSE 103* -- 109*  BUN 32* -- 29*  CREATININE 1.44* 1.29 --  CALCIUM 8.7 -- 9.2  MG -- -- --  PHOS -- -- --    Basename 10/30/11 0545 10/29/11 0650  WBC 6.4 6.4  NEUTROABS -- --  HGB 9.2* 10.3*  HCT 27.2* 30.8*  MCV 90.4 91.1  PLT 213 239   Cardiac Enzymes:  Basename 10/28/11 2006  CKTOTAL --  CKMB --  CKMBINDEX --  TROPONINI <0.30   BNP: No results found for this basename: POCBNP:3 in the last 72 hours D-Dimer: No results found for this basename: DDIMER:2 in the last 72 hours CBG:  Basename 10/30/11 1157 10/30/11 0728 10/29/11 1714 10/29/11 1208 10/29/11 0724  GLUCAP 123* 92 174* 125* 92    Recent Results (from the past 240 hour(s))  URINE CULTURE     Status: Normal   Collection Time   10/28/11 10:10 PM      Component Value Range Status Comment   Specimen  Description URINE, CATHETERIZED   Final    Special Requests NONE   Final    Setup Time [id number]   Final    Colony Count NO GROWTH   Final    Culture NO GROWTH   Final    Report Status 10/30/2011 FINAL   Final   MRSA PCR SCREENING     Status: Abnormal   Collection Time   10/29/11  6:19 AM      Component Value Range Status Comment   MRSA by PCR POSITIVE (*) NEGATIVE  Final     Studies/Results: Dg Chest 1 View  10/28/2011  *RADIOLOGY REPORT*  Clinical Data: Preop hip fracture  CHEST - 1 VIEW  Comparison: 02/08/2005  Findings: Chronic interstitial markings. No pleural effusion or pneumothorax.  Cardiomediastinal silhouette is within normal limits.  Degenerative changes of the visualized thoracolumbar spine.  IMPRESSION: No evidence of acute cardiopulmonary disease.  Original Report Authenticated By: Charline Bills, M.D.   Dg Hip Complete Right  10/28/2011  *RADIOLOGY REPORT*  Clinical Data: Larey Seat and injured right hip.  RIGHT HIP - COMPLETE 2+ VIEW 10/28/2011:  Comparison: None.  Findings: Fracture involving the right acetabulum, extending into the right superior pubic ramus.  No fractures involving the femoral head or neck.  Severe axial joint space narrowing.  Included AP pelvis demonstrate no fractures elsewhere.  Severe axial joint space narrowing in the  contralateral left hip. Sacroiliac joints and symphysis pubis intact.  Severe degenerative changes throughout the visualized lower lumbar spine.  Severe generalized osteopenia.  IMPRESSION: Fracture involving the right acetabulum with extension to the right superior pubic ramus.  Severe osteopenia.  Severe osteoarthritis in both hips.  Original Report Authenticated By: Arnell Sieving, M.D.    Medications: Scheduled Meds:   . amLODipine  10 mg Oral Daily  . enoxaparin  40 mg Subcutaneous Daily  . insulin aspart  0-15 Units Subcutaneous TID WC  . insulin aspart  0-5 Units Subcutaneous QHS  . pioglitazone  30 mg Oral Q breakfast    Continuous Infusions:  PRN Meds:.acetaminophen, acetaminophen, alum & mag hydroxide-simeth, morphine, ondansetron (ZOFRAN) IV, ondansetron, oxyCODONE, sodium chloride, zolpidem  Assessment/Plan:   Fracture acetabulum-closed Conservative management No surgery.  Dr Boneta Lucks will see him in am.  Chronic pain Cont morphine prn. Oxycodone. Will give the patient morphine before moving for personal needs.   Hypertension Cont Amlodipine.   Diabetes mellitus SSI, pioglitazone.  Palliative care meeting in am for goals of care.....    LOS: 2 days   [facility] Pager: [phone] 10/30/2011, 2:45 PM

## 2011-10-30 NOTE — Progress Notes (Signed)
Spoke with son Karren Burly on the phone this AM re: prelim goals of care for the patient. He has a good understanding of the current medical issues and needs for his father. We have a formal GOC set for Monday AM at 11:30.   Summary: 1. Patient is now DNR, we discussed this in detail over the phone, father would not want CPR or to be on mechanical ventilation. Son has POA, MPOA.  2. Pain control.  3. Will give every opportunity to improve and rehab with Palliative and address goals based on progression or decline.

## 2011-10-31 ENCOUNTER — Inpatient Hospital Stay (HOSPITAL_COMMUNITY): Payer: Medicare Other

## 2011-10-31 LAB — GLUCOSE, CAPILLARY: Glucose-Capillary: 149 mg/dL — ABNORMAL HIGH (ref 70–99)

## 2011-10-31 MED ORDER — POTASSIUM CHLORIDE CRYS ER 20 MEQ PO TBCR
20.0000 meq | EXTENDED_RELEASE_TABLET | Freq: Two times a day (BID) | ORAL | Status: AC
Start: 2011-10-31 — End: 2011-11-02
  Administered 2011-10-31 – 2011-11-02 (×4): 20 meq via ORAL
  Filled 2011-10-31 (×4): qty 1

## 2011-10-31 NOTE — Progress Notes (Signed)
Physical Therapy Evaluation Patient Details Name: [name] MRN: [id number] DOB: 1919-10-01 Today's Date: 10/31/2011 Time: 4098-1191  Eval II Problem List:  Patient Active Problem List  Diagnoses  . Fracture acetabulum-closed  . Hypertension  . Diabetes mellitus  . Hyperlipidemia    Past Medical History:  Past Medical History  Diagnosis Date  . Hyperlipemia   . Diabetes mellitus   . H/O: CVA (cardiovascular accident)     affected left side  . HTN (hypertension)    Past Surgical History:  Past Surgical History  Procedure Date  . Transurethral resection of prostate   . Cataract extraction w/ intraocular lens implant     PT Assessment/Plan/Recommendation PT Assessment Clinical Impression Statement: Pt presents with diagnosis of right acetabular fracture extending to right superior pubic ramus. Pt is currently NWB on R LE and activity is for transfers only. Pt currently requires +2 assist for all mobility. Pt will benefit from skilled PT in the acute care setting and follow-up PT at SNF to improve strength, activity tolerance and  transfers in preperation for D/C.  PT Recommendation/Assessment: Patient will need skilled PT in the acute care venue PT Problem List: Decreased strength;Decreased activity tolerance;Decreased balance;Decreased mobility;Decreased knowledge of use of DME;Pain;Decreased range of motion PT Therapy Diagnosis : Acute pain;Difficulty walking PT Plan PT Frequency: Min 3X/week PT Treatment/Interventions: DME instruction;Functional mobility training;Therapeutic activities;Therapeutic exercise;Balance training;Patient/family education PT Recommendation Recommendations for Other Services: OT consult Follow Up Recommendations: Skilled nursing facility Equipment Recommended: Defer to next venue PT Goals  Acute Rehab PT Goals PT Goal Formulation: With patient Pt will go Supine/Side to Sit: with max assist;with rail Pt will go Sit to Supine/Side: with max  assist;with rail Pt will go Sit to Stand: with max assist Pt will go Stand to Sit: with max assist Pt will Transfer Bed to Chair/Chair to Bed: with max assist Pt will Perform Home Exercise Program: with min assist  PT Evaluation Precautions/Restrictions  Precautions Precautions: Fall Restrictions Weight Bearing Restrictions: Yes RLE Weight Bearing: Non weight bearing Other Position/Activity Restrictions: Transfers only Prior Functioning  Home Living Lives With: Alone Receives Help From: Personal care attendant (8-3 daily) Type of Home: House Home Layout: One level Home Adaptive Equipment: Walker - rolling;Shower chair with back;Wheelchair - manual;Quad cane Prior Function Level of Independence: Needs assistance with homemaking;Needs assistance with ADLs;Needs assistance with gait;Needs assistance with tranfers Driving: No Cognition Cognition Arousal/Alertness: Awake/alert Overall Cognitive Status: Appears within functional limits for tasks assessed Orientation Level: Oriented X4 Sensation/Coordination   Extremity Assessment RLE Assessment RLE Assessment: Not tested (pt able to move ankles and able to assist minimally.) LLE Assessment LLE Assessment: Exceptions to [facility] LLE Strength LLE Overall Strength Comments: Knee ext 3+/5, hip flexion 3/5, DF 3+/5 - residual weakness from prior CVA Mobility (including Balance) Bed Mobility Bed Mobility: Yes Supine to Sit: 1: +2 Total assist;HOB elevated (Comment degrees);With rails (Pt=40%) Supine to Sit Details (indicate cue type and reason): VCs safety, technique, hand placement. Encouraged pt use UEs to assist with trunk and positioning. Assist for trunk to upright and R LE off bed. Increased time. Utilized bed pad to assist.  Transfers Transfers: Yes Sit to Stand: 1: +2 Total assist;From bed;From elevated surface;With upper extremity assist (Pt=40%) Sit to Stand Details (indicate cue type and reason): VCs safety, technique. Assist  to rise and stabilize in standing. Pt able to stand for 10 seconds but unable to pivot or hop. Assist to keep R LE off floor.  Stand to Sit: 1: +2 Total  assist;To bed;To elevated surface;With upper extremity assist (Pt=40%) Stand to Sit Details: VCs safety, technique. Assist to control descent. Squat Pivot Transfers: 1: +2 Total assist;From elevated surface (Pt=40%) Squat Pivot Transfer Details (indicate cue type and reason): VCs safety,technique. Assist to keep R LE off floor during squat pivot. Bed to recliner towards L side.  Ambulation/Gait Ambulation/Gait: No  Posture/Postural Control Posture/Postural Control: No significant limitations Exercise    End of Session PT - End of Session Equipment Utilized During Treatment: Gait belt Activity Tolerance: Patient limited by pain Patient left: in chair;with call bell in reach Nurse Communication: Need for lift equipment General Behavior During Session: [facility] for tasks performed Cognition: [facility] for tasks performed  Rebeca Alert [facility] 10/31/2011, 12:12 PM

## 2011-10-31 NOTE — Consult Note (Signed)
Reason for Consult: Right acetabular fracture Referring Physician:  Hospitalist/ER  [name] is an 77 y.o. male.  HPI: This is a rather pleasant 77 year old gentleman who was in the kitchen on the day of admission, he turned to place something on the counter and lost his balance and fell. He fell squarely on his buttock, he had no loss of consciousness, he did not hit his head. He called his son, as he was unable to get up. His son called 911, he was brought to the ER. Reports her chest pains, no dizziness. Patient had a mechanical fall.  At time of evaluation he had no other complaints, other than his right hip pain  Past Medical History  Diagnosis Date  . Hyperlipemia   . Diabetes mellitus   . H/O: CVA (cardiovascular accident)     affected left side  . HTN (hypertension)     Past Surgical History  Procedure Date  . Transurethral resection of prostate   . Cataract extraction w/ intraocular lens implant     No family history on file.  Social History:  reports that he has never smoked. He does not have any smokeless tobacco history on file. He reports that he does not drink alcohol or use illicit drugs.  Allergies: No Known Allergies  Medications: I have reviewed the patient's current medications.  Results for orders placed during the hospital encounter of 10/28/11 (from the past 24 hour(s))  GLUCOSE, CAPILLARY     Status: Normal   Collection Time   10/30/11  7:28 AM      Component Value Range   Glucose-Capillary 92  70 - 99 (mg/dL)   Comment 1 Notify RN    GLUCOSE, CAPILLARY     Status: Abnormal   Collection Time   10/30/11 11:57 AM      Component Value Range   Glucose-Capillary 123 (*) 70 - 99 (mg/dL)   Comment 1 Notify RN    GLUCOSE, CAPILLARY     Status: Abnormal   Collection Time   10/30/11  4:49 PM      Component Value Range   Glucose-Capillary 145 (*) 70 - 99 (mg/dL)   Comment 1 Notify RN    GLUCOSE, CAPILLARY     Status: Abnormal   Collection Time   10/30/11  9:55 PM      Component Value Range   Glucose-Capillary 118 (*) 70 - 99 (mg/dL)   Comment 1 Notify RN      X-ray: RIGHT HIP - COMPLETE 2+ VIEW 10/28/2011 Fracture involving the right acetabulum with extension to the right  superior pubic ramus. Severe osteopenia. Severe osteoarthritis in  both hips  ROS: The patient denies anorexia, fever, weight loss,, vision loss, decreased hearing, hoarseness, chest pain, syncope, dyspnea on exertion, peripheral edema, balance deficits, hemoptysis, abdominal pain, melena, hematochezia, severe indigestion/heartburn, hematuria, incontinence, genital sores, muscle weakness, suspicious skin lesions, transient blindness, difficulty walking, depression, unusual weight change, abnormal bleeding, enlarged lymph nodes, angioedema, and breast masses.      Blood pressure 119/53, pulse 82, temperature 97.6 F (36.4 C), temperature source Oral, resp. rate 18, height 5\' 6"  (1.676 m), weight 62.1 kg (136 lb 14.5 oz), SpO2 96.00%.  Awake alert cooperative, son in the room No acute distress Pain with RLE movement, thus limited exam Otherwise NVI RLE  Assessment/Plan: Right acetabular insufficiency fracture Non-operative management recommended PT/OT - NWB RLE, but may work on UE and LLE strengthening, pivot transfers permitted on LLE Will need SNF at D/C as patient  lives alone F/u with Dr. Charlann Boxer, [facility], [phone], in 3 weeks after D/C Any questions please call, [phone]  Shelda Pal 10/31/2011, 6:51 AM

## 2011-10-31 NOTE — Progress Notes (Signed)
UR CHART REVIEWED; B Janiyah Beery RN, BSN, MHA 

## 2011-10-31 NOTE — Consult Note (Signed)
Consult is for review of medical treatment options, clarification of goals of care and end of life issues, disposition and options, and symptom recommendation.  This NP Lorinda Creed reviewed medical records, received report from team, assessed the patient and then meet at the patient's bedside along with hi son Karren Burly and his wife to discuss diagnosis prognosis, GOC, EOL wishes disposition and options.  Problem List: Principal Problem:  *Fracture acetabulum-closed Active Problems:  Hypertension  Diabetes mellitus  Hyperlipidemia Associated pain to fx Immobility   Patient Goals/ Recommendations: 1. DNR/DNI 2. Continue to treat treatable illness while patient attempts to rehab     -open to use of all medications, re hospitalizations 3.  Anticipates dc to SNF for Rehab,  Recommend Palliative Care to follow on dc 4.  Future GOC dependent on outcomes   A detailed discussion was had today regarding advanced directives.  Concepts specific to code status, artifical feeding and hydration, continued IV antibiotics and rehospitalization was had.  The difference between a aggressive medical intervention path  and a palliative comfort care path for this patient at this time was had.  Values and goals of care important to patient and family were attempted to be elicited.  Hard choices left for review  Total time spent on the unit was 60 minutes.  Time in 1130 time out 1230. Greater than 50% of the time was spent in counseling and coordination of care.    Lorinda Creed NP  [phone]

## 2011-10-31 NOTE — Progress Notes (Signed)
Patient and family meeting with Palliative Care team upon my arrival on unit today- will follow up tomorrow for further assistance and support-  Reece Levy, MSW, [employer] [phone]

## 2011-10-31 NOTE — Progress Notes (Addendum)
Subjective: Acetabular fracture  Patient reports pain as mild. Awake, alert and oriented. No events.  Objective:   VITALS:   Filed Vitals:   10/31/11 0540  BP: 119/53  Pulse: 82  Temp: 97.6 F (36.4 C)  Resp: 18    Neurovascular intact Dorsiflexion/Plantar flexion intact  LABS  Basename 10/30/11 0545 10/29/11 0650 10/28/11 2006  HGB 9.2* 10.3* 11.3*  HCT 27.2* 30.8* 33.8*  WBC 6.4 6.4 6.5  PLT 213 239 246     Basename 10/30/11 0545 10/29/11 0650 10/28/11 2006  NA 136 -- 135  K 3.4* -- 4.6  BUN 32* -- 29*  CREATININE 1.44* 1.29 1.28  GLUCOSE 103* -- 109*    Assessment/Plan: Acetabular fracture    Up with therapy NWB right LE Orthopaedically can be discharged, suggestion would be to SNF for additional care. Follow up in 3 weeks with Dr. Charlann Boxer at [facility]    Anastasio Auerbach. Braxen Dobek   PAC  10/31/2011, 7:44 AM

## 2011-10-31 NOTE — Progress Notes (Signed)
Subjective: Patient seen, complains of pain in hip only at movement. Palliative care meeting today. Objective: Vital signs in last 24 hours: Temp:  [97.6 F (36.4 C)-98.2 F (36.8 C)] 98.1 F (36.7 C) (12/10 1503) Pulse Rate:  [82-101] 84  (12/10 1503) Resp:  [16-18] 16  (12/10 1503) BP: (93-140)/(53-62) 93/55 mmHg (12/10 1503) SpO2:  [96 %-98 %] 96 % (12/10 0540) Weight change:  Last BM Date: 10/27/11  Intake/Output from previous day: 12/09 0701 - 12/10 0700 In: 240 [P.O.:240] Out: 1850 [Urine:1850] Total I/O In: 480 [P.O.:480] Out: 700 [Urine:700]   Physical Exam: General: Alert, awake, oriented x3, in no acute distress. HEENT: No bruits, no goiter. Heart: Regular rate and rhythm, without murmurs, rubs, gallops. Lungs: Clear to auscultation bilaterally. Abdomen: Soft, nontender, nondistended, positive bowel sounds. Extremities: No clubbing cyanosis or edema with positive pedal pulses. Neuro: Grossly intact, nonfocal.    Lab Results: Basic Metabolic Panel:  Basename 10/30/11 0545 10/29/11 0650 10/28/11 2006  NA 136 -- 135  K 3.4* -- 4.6  CL 103 -- 101  CO2 24 -- 25  GLUCOSE 103* -- 109*  BUN 32* -- 29*  CREATININE 1.44* 1.29 --  CALCIUM 8.7 -- 9.2  MG -- -- --  PHOS -- -- --    Basename 10/30/11 0545 10/29/11 0650  WBC 6.4 6.4  NEUTROABS -- --  HGB 9.2* 10.3*  HCT 27.2* 30.8*  MCV 90.4 91.1  PLT 213 239   Cardiac Enzymes:  Basename 10/28/11 2006  CKTOTAL --  CKMB --  CKMBINDEX --  TROPONINI <0.30   BNP: No components found with this basename: POCBNP:3 D-Dimer: No results found for this basename: DDIMER:2 in the last 72 hours CBG:  Basename 10/31/11 1132 10/31/11 0728 10/30/11 2155 10/30/11 1649 10/30/11 1157 10/30/11 0728  GLUCAP 115* 90 118* 145* 123* 92    Recent Results (from the past 240 hour(s))  URINE CULTURE     Status: Normal   Collection Time   10/28/11 10:10 PM      Component Value Range Status Comment   Specimen Description  URINE, CATHETERIZED   Final    Special Requests NONE   Final    Setup Time [id number]   Final    Colony Count NO GROWTH   Final    Culture NO GROWTH   Final    Report Status 10/30/2011 FINAL   Final   MRSA PCR SCREENING     Status: Abnormal   Collection Time   10/29/11  6:19 AM      Component Value Range Status Comment   MRSA by PCR POSITIVE (*) NEGATIVE  Final     Studies/Results: Ct Pelvis Wo Contrast  10/31/2011  *RADIOLOGY REPORT*  Clinical Data:  Right hip pain status post fall 3 days ago. Evaluate right acetabular fracture.  CT PELVIS WITHOUT CONTRAST  Technique:  Multidetector CT imaging of the pelvis was performed following the standard protocol without intravenous contrast.  Comparison:  Hip radiographs 12/18/2010 and 10/28/2011.  Findings:  There is a mildly displaced transverse fracture of the right acetabulum.  The components involving the anterior and medial acetabular walls are mildly comminuted and displaced. There is no displacement of the posterior wall. There is a nondisplaced fracture involving the inferior pubic ramus.  No significant displacement of the components involving the obturator ring is identified.  There is no involvement of the iliac wing.  The sacrum and sacroiliac joints are intact.  There is no left- sided pelvic fracture.  Lower  lumbar spine degenerative changes are present with disc vacuum phenomenon and bilateral facet hypertrophy.  There are mild degenerative changes of both hips.  There is a small to moderate right hip joint effusion.  Foley catheter is in place.  The bladder is decompressed with mild wall thickening and surrounding soft tissue stranding in the perivesical fat. There is moderate stool in the rectum.  No focal pelvic hematoma is demonstrated.  There are small umbilical and left inguinal hernias containing only fat.  Diffuse vascular calcifications are noted.  IMPRESSION:  1.  Mildly comminuted and mildly displaced T-shaped fracture of the right  acetabulum as described. 2.  Associated small to moderate right hip joint effusion. 3.  Perivesical soft tissue stranding and bladder wall thickening suggesting cystitis.  Correlate clinically.  Original Report Authenticated By: Gerrianne Scale, M.D.    Medications: Scheduled Meds:    . amLODipine  10 mg Oral Daily  . enoxaparin  40 mg Subcutaneous Daily  . insulin aspart  0-15 Units Subcutaneous TID WC  . insulin aspart  0-5 Units Subcutaneous QHS  . pioglitazone  30 mg Oral Q breakfast   Continuous Infusions:  PRN Meds:.acetaminophen, acetaminophen, alum & mag hydroxide-simeth, morphine, ondansetron (ZOFRAN) IV, ondansetron, oxyCODONE, sodium chloride, zolpidem  Assessment/Plan:   Fracture acetabulum-closed Conservative management No surgery per ortho.    Chronic pain Cont morphine prn. Oxycodone.  Hypertension Cont Amlodipine.  Diabetes mellitus SSI, pioglitazone.  Hypokalemia: Will replace potassium.  Palliative care meeting today.    LOS: 3 days   [facility] Pager: [phone] 10/31/2011, 3:40 PM

## 2011-11-01 LAB — GLUCOSE, CAPILLARY
Glucose-Capillary: 177 mg/dL — ABNORMAL HIGH (ref 70–99)
Glucose-Capillary: 150 mg/dL — ABNORMAL HIGH (ref 70–99)

## 2011-11-01 NOTE — Progress Notes (Signed)
Met with patient and son today- discussed plans for SNF placement. They are in agreement with these plans- CSW Psychosocial Assessment and FL2 placed in shadow chart in [facility]. Will proceed with SNF search and advise. Reece Levy, MSW, Theresia Majors [phone]

## 2011-11-01 NOTE — Plan of Care (Signed)
Problem: Phase I Progression Outcomes Goal: Pre op-initial discharge plan identified Outcome: Not Applicable Date Met:  11/01/11 No surgery  Goal: Pre op hemodynamically stable Outcome: Not Applicable Date Met:  11/01/11 No surgery  Goal: Post op pain controlled with appropriate interventions Outcome: Not Applicable Date Met:  11/01/11 No surgery  Goal: Post op hemodynamically stable Outcome: Not Applicable Date Met:  11/01/11 No surgery  Goal: Post op CMS/Neurovascular status WDL Outcome: Not Applicable Date Met:  11/01/11 No surgery  Goal: Post op clear liquids, advance diet as tolerated Outcome: Not Applicable Date Met:  11/01/11 No surgery   Problem: Phase III Progression Outcomes Goal: Incision clean - minimal/no drainage Outcome: Not Applicable Date Met:  11/01/11 There is no incision, no surgery performed   Problem: Phase I Progression Outcomes Goal: Pain controlled with appropriate interventions Outcome: Progressing No pain when laying still, some pain with movement  Goal: Initial discharge plan identified Outcome: Progressing Plans to go for rehab

## 2011-11-01 NOTE — Progress Notes (Signed)
Subjective: Patient seen, complains of pain in hip only at movement.  Objective: Vital signs in last 24 hours: Temp:  [97.7 F (36.5 C)-98.2 F (36.8 C)] 97.7 F (36.5 C) (12/11 1430) Pulse Rate:  [70-97] 85  (12/11 1430) Resp:  [17-20] 20  (12/11 1430) BP: (115-123)/(43-49) 115/47 mmHg (12/11 1430) SpO2:  [98 %-100 %] 98 % (12/11 1430) Weight change:  Last BM Date: 11/01/11  Intake/Output from previous day: 12/10 0701 - 12/11 0700 In: 1125 [P.O.:1125] Out: 1350 [Urine:1350]     Physical Exam: General: Alert, awake, oriented x3, in no acute distress. HEENT: No bruits, no goiter. Heart: Regular rate and rhythm, without murmurs, rubs, gallops. Lungs: Clear to auscultation bilaterally. Abdomen: Soft, nontender, nondistended, positive bowel sounds. Extremities: No clubbing cyanosis or edema with positive pedal pulses. Neuro: Grossly intact, nonfocal.    Lab Results: Basic Metabolic Panel:  Basename 10/30/11 0545  NA 136  K 3.4*  CL 103  CO2 24  GLUCOSE 103*  BUN 32*  CREATININE 1.44*  CALCIUM 8.7  MG --  PHOS --    Basename 10/30/11 0545  WBC 6.4  NEUTROABS --  HGB 9.2*  HCT 27.2*  MCV 90.4  PLT 213   Cardiac Enzymes: No results found for this basename: CKTOTAL:3,CKMB:3,CKMBINDEX:3,TROPONINI:3 in the last 72 hours BNP: No components found with this basename: POCBNP:3 D-Dimer: No results found for this basename: DDIMER:2 in the last 72 hours CBG:  Basename 11/01/11 1639 11/01/11 1140 11/01/11 0747 10/31/11 2052 10/31/11 1658 10/31/11 1132  GLUCAP 142* 118* 100* 177* 149* 115*    Recent Results (from the past 240 hour(s))  URINE CULTURE     Status: Normal   Collection Time   10/28/11 10:10 PM      Component Value Range Status Comment   Specimen Description URINE, CATHETERIZED   Final    Special Requests NONE   Final    Setup Time [id number]   Final    Colony Count NO GROWTH   Final    Culture NO GROWTH   Final    Report Status 10/30/2011 FINAL    Final   MRSA PCR SCREENING     Status: Abnormal   Collection Time   10/29/11  6:19 AM      Component Value Range Status Comment   MRSA by PCR POSITIVE (*) NEGATIVE  Final     Studies/Results: Ct Pelvis Wo Contrast  10/31/2011  *RADIOLOGY REPORT*  Clinical Data:  Right hip pain status post fall 3 days ago. Evaluate right acetabular fracture.  CT PELVIS WITHOUT CONTRAST  Technique:  Multidetector CT imaging of the pelvis was performed following the standard protocol without intravenous contrast.  Comparison:  Hip radiographs 12/18/2010 and 10/28/2011.  Findings:  There is a mildly displaced transverse fracture of the right acetabulum.  The components involving the anterior and medial acetabular walls are mildly comminuted and displaced. There is no displacement of the posterior wall. There is a nondisplaced fracture involving the inferior pubic ramus.  No significant displacement of the components involving the obturator ring is identified.  There is no involvement of the iliac wing.  The sacrum and sacroiliac joints are intact.  There is no left- sided pelvic fracture.  Lower lumbar spine degenerative changes are present with disc vacuum phenomenon and bilateral facet hypertrophy.  There are mild degenerative changes of both hips.  There is a small to moderate right hip joint effusion.  Foley catheter is in place.  The bladder is decompressed with mild  wall thickening and surrounding soft tissue stranding in the perivesical fat. There is moderate stool in the rectum.  No focal pelvic hematoma is demonstrated.  There are small umbilical and left inguinal hernias containing only fat.  Diffuse vascular calcifications are noted.  IMPRESSION:  1.  Mildly comminuted and mildly displaced T-shaped fracture of the right acetabulum as described. 2.  Associated small to moderate right hip joint effusion. 3.  Perivesical soft tissue stranding and bladder wall thickening suggesting cystitis.  Correlate clinically.   Original Report Authenticated By: Gerrianne Scale, M.D.    Medications: Scheduled Meds:    . amLODipine  10 mg Oral Daily  . enoxaparin  40 mg Subcutaneous Daily  . insulin aspart  0-15 Units Subcutaneous TID WC  . insulin aspart  0-5 Units Subcutaneous QHS  . pioglitazone  30 mg Oral Q breakfast  . potassium chloride  20 mEq Oral BID   Continuous Infusions:  PRN Meds:.acetaminophen, acetaminophen, alum & mag hydroxide-simeth, morphine, ondansetron (ZOFRAN) IV, ondansetron, oxyCODONE, sodium chloride, zolpidem  Assessment/Plan:   Fracture acetabulum-closed Conservative management No surgery per ortho.    Chronic pain Cont morphine prn. Oxycodone.  Hypertension Cont Amlodipine.  Diabetes mellitus SSI, pioglitazone.  Hypokalemia: Will replace potassium.  Patient wants to go to SNF..    LOS: 4 days   [facility] Pager: [phone] 11/01/2011, 5:59 PM

## 2011-11-01 NOTE — Progress Notes (Signed)
Subjective: Acetabular fracture  Patient reports pain as mild. No events.  Objective:   VITALS:   Filed Vitals:   11/01/11 0636  BP: 122/43  Pulse: 70  Temp: 98.2 F (36.8 C)  Resp: 18    Neurovascular intact  LABS No new labs  Assessment/Plan: Acetabular fracture    Up with therapy NWB right LE  Continue plan as previously established.   Anastasio Auerbach Riana Tessmer   PAC  11/01/2011, 8:37 AM

## 2011-11-02 LAB — GLUCOSE, CAPILLARY
Glucose-Capillary: 108 mg/dL — ABNORMAL HIGH (ref 70–99)
Glucose-Capillary: 148 mg/dL — ABNORMAL HIGH (ref 70–99)
Glucose-Capillary: 142 mg/dL — ABNORMAL HIGH (ref 70–99)

## 2011-11-02 LAB — BASIC METABOLIC PANEL
BUN: 50 mg/dL — ABNORMAL HIGH (ref 6–23)
CO2: 24 mEq/L (ref 19–32)
Calcium: 8.8 mg/dL (ref 8.4–10.5)
Creatinine, Ser: 1.66 mg/dL — ABNORMAL HIGH (ref 0.50–1.35)

## 2011-11-02 MED ORDER — ENOXAPARIN SODIUM 30 MG/0.3ML ~~LOC~~ SOLN
30.0000 mg | Freq: Every day | SUBCUTANEOUS | Status: DC
  Administered 2011-11-03 – 2011-11-04 (×2): 30 mg via SUBCUTANEOUS
  Filled 2011-11-02 (×2): qty 0.3

## 2011-11-02 MED ORDER — ENOXAPARIN SODIUM 30 MG/0.3ML ~~LOC~~ SOLN: 30.0000 mg | Freq: Every day | SUBCUTANEOUS | Status: DC

## 2011-11-02 NOTE — Progress Notes (Signed)
Physical Therapy Treatment Patient Details Name: [name] MRN: [id number] DOB: 04-Jun-1919 Today's Date: 11/02/2011 10:35 - 11:00 2 ta PT Assessment/Plan  PT - Assessment/Plan Comments on Treatment Session: Pt plans to D/C to SNF PT Plan: Discharge plan remains appropriate PT Frequency: Min 3X/week Follow Up Recommendations: Skilled nursing facility Equipment Recommended: Defer to next venue PT Goals  Acute Rehab PT Goals PT Goal Formulation: With patient PT Goal: Supine/Side to Sit - Progress: Progressing toward goal PT Goal: Sit to Supine/Side - Progress: Progressing toward goal PT Goal: Sit to Stand - Progress: Progressing toward goal PT Goal: Stand to Sit - Progress: Progressing toward goal PT Transfer Goal: Bed to Chair/Chair to Bed - Progress: Progressing toward goal PT Goal: Perform Home Exercise Program - Progress: Progressing toward goal  PT Treatment Precautions/Restrictions  Precautions Precautions: Fall Restrictions Weight Bearing Restrictions: Yes RLE Weight Bearing: Non weight bearing Other Position/Activity Restrictions: Transfers only Mobility (including Balance)   bed to chair transfer only 2nd NWB R LE which is his stronger side.  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   Exercise    End of Session PT - End of Session Equipment Utilized During Treatment: Gait belt Activity Tolerance: Patient tolerated treatment well Patient left: in chair;with call bell in reach Nurse Communication: Mobility status for transfers General Behavior During Session: [facility] for tasks performed Cognition: [facility] for tasks performed  Felecia Shelling PTA [facility]  Acute  Rehab Pager     [phone]

## 2011-11-02 NOTE — Plan of Care (Signed)
Problem: Discharge Progression Outcomes Goal: Ambulates safely using assistive device Outcome: Not Progressing SNF/6-week rehab is being recommended.

## 2011-11-02 NOTE — Progress Notes (Signed)
Patient ID: [name], male   DOB: [birth year], 77 y.o.   MRN: [id number]  Subjective: No events overnight. Patient denies chest pain, shortness of breath, abdominal pain. Had bowel movement and reports ambulating.  Objective:  Vital signs in last 24 hours:  Filed Vitals:   11/02/11 0040 11/02/11 0520 11/02/11 1312 11/02/11 2051  BP: 162/66 120/59 118/41 115/52  Pulse: 84 73 84 90  Temp: 98 F (36.7 C) 98.8 F (37.1 C) 97.9 F (36.6 C) 98.8 F (37.1 C)  TempSrc: Oral Oral Oral Oral  Resp: 18 16 18 19   Height:      Weight: 61.6 kg (135 lb 12.9 oz)     SpO2: 100% 96% 99% 98%    Intake/Output from previous day:   Intake/Output Summary (Last 24 hours) at 11/02/11 2237 Last data filed at 11/02/11 1900  Gross per 24 hour  Intake    685 ml  Output   1376 ml  Net   -691 ml    Physical Exam: General: Alert, awake, oriented x3, in no acute distress. HEENT: No bruits, no goiter. Moist mucous membranes, no scleral icterus, no conjunctival pallor. Heart: Regular rate and rhythm, without murmurs, rubs, gallops. Lungs: Clear to auscultation bilaterally. No wheezing, no rhonchi, no rales.  Abdomen: Soft, nontender, nondistended, positive bowel sounds. Extremities: No clubbing cyanosis or edema,  positive pedal pulses. Neuro: Grossly intact, nonfocal.    Lab Results:  Basic Metabolic Panel:    Component Value Date/Time   NA 137 11/02/2011 0340   K 4.0 11/02/2011 0340   CL 106 11/02/2011 0340   CO2 24 11/02/2011 0340   BUN 50* 11/02/2011 0340   CREATININE 1.66* 11/02/2011 0340   GLUCOSE 113* 11/02/2011 0340   CALCIUM 8.8 11/02/2011 0340   CBC:    Component Value Date/Time   WBC 6.4 10/30/2011 0545   HGB 9.2* 10/30/2011 0545   HCT 27.2* 10/30/2011 0545   PLT 213 10/30/2011 0545   MCV 90.4 10/30/2011 0545   NEUTROABS 3.8 12/18/2010 1133   LYMPHSABS 2.0 12/18/2010 1133   MONOABS 0.7 12/18/2010 1133   EOSABS 0.2 12/18/2010 1133   BASOSABS 0.0 12/18/2010 1133      Lab  10/30/11 0545 10/29/11 0650 10/28/11 2006  WBC 6.4 6.4 6.5  HGB 9.2* 10.3* 11.3*  HCT 27.2* 30.8* 33.8*  PLT 213 239 246  MCV 90.4 91.1 91.4  MCH 30.6 30.5 30.5  MCHC 33.8 33.4 33.4  RDW 14.6 14.6 14.6  LYMPHSABS -- -- --  MONOABS -- -- --  EOSABS -- -- --  BASOSABS -- -- --  BANDABS -- -- --    Lab 11/02/11 0340 10/30/11 0545 10/29/11 0650 10/28/11 2006  NA 137 136 -- 135  K 4.0 3.4* -- 4.6  CL 106 103 -- 101  CO2 24 24 -- 25  GLUCOSE 113* 103* -- 109*  BUN 50* 32* -- 29*  CREATININE 1.66* 1.44* 1.29 1.28  CALCIUM 8.8 8.7 -- 9.2  MG -- -- -- --   No results found for this basename: INR:5,PROTIME:5 in the last 168 hours Cardiac markers:  Lab 10/28/11 2006  CKMB --  TROPONINI <0.30  MYOGLOBIN --   No components found with this basename: POCBNP:3 Recent Results (from the past 240 hour(s))  URINE CULTURE     Status: Normal   Collection Time   10/28/11 10:10 PM      Component Value Range Status Comment   Specimen Description URINE, CATHETERIZED   Final  Special Requests NONE   Final    Setup Time [id number]   Final    Colony Count NO GROWTH   Final    Culture NO GROWTH   Final    Report Status 10/30/2011 FINAL   Final   MRSA PCR SCREENING     Status: Abnormal   Collection Time   10/29/11  6:19 AM      Component Value Range Status Comment   MRSA by PCR POSITIVE (*) NEGATIVE  Final     Studies/Results: No results found.  Medications: Scheduled Meds:   . amLODipine  10 mg Oral Daily  . enoxaparin (LOVENOX) injection  30 mg Subcutaneous Daily  . insulin aspart  0-15 Units Subcutaneous TID WC  . insulin aspart  0-5 Units Subcutaneous QHS  . pioglitazone  30 mg Oral Q breakfast  . potassium chloride  20 mEq Oral BID  . DISCONTD: enoxaparin (LOVENOX) injection  30 mg Subcutaneous Daily  . DISCONTD: enoxaparin  40 mg Subcutaneous Daily   Continuous Infusions:  PRN Meds:.acetaminophen, acetaminophen, alum & mag hydroxide-simeth, morphine, ondansetron (ZOFRAN)  IV, ondansetron, oxyCODONE, sodium chloride, zolpidem  Assessment/Plan:  Fracture acetabulum-closed  Conservative management  No surgery per ortho.  Chronic pain  Cont morphine prn.  Oxycodone.  Hypertension  Cont Amlodipine.  Diabetes mellitus  SSI, pioglitazone.  Hypokalemia:  Will replace potassium.  Patient wants to go to SNF.Marland KitchenPlacement pending    LOS: 5 days   MAGICK-Danashia Landers 11/02/2011, 10:37 PM

## 2011-11-02 NOTE — Plan of Care (Signed)
Problem: Discharge Progression Outcomes Goal: Incision without S/S infection Outcome: Not Applicable Date Met:  11/02/11 No surgery performed.

## 2011-11-02 NOTE — Progress Notes (Signed)
Mr Iyer was received in transfer from 5E at about 0030  to make telemetry bed available .  Alert and oriented.  Oreinted to pt room and unit and reason for transfer.  5E nurse called son to notify of pt's transfer.  B Lorijean Husser RN

## 2011-11-02 NOTE — Progress Notes (Signed)
MEDICATION RELATED CONSULT NOTE - INITIAL   Pharmacy Consult for Lovenox Indication: VTE Prophylaxis  No Known Allergies  Patient Measurements: Height: 5\' 6"  (167.6 cm) Weight: 135 lb 12.9 oz (61.6 kg) IBW/kg (Calculated) : 63.8    Vital Signs: Temp: 98.8 F (37.1 C) (12/12 0520) Temp src: Oral (12/12 0520) BP: 120/59 mmHg (12/12 0520) Pulse Rate: 73  (12/12 0520) Intake/Output from previous day: 12/11 0701 - 12/12 0700 In: 265 [P.O.:265] Out: 1601 [Urine:1600; Stool:1] Intake/Output from this shift:    Labs:  [facility] 11/02/11 0340  WBC --  HGB --  HCT --  PLT --  APTT --  CREATININE 1.66*  LABCREA --  CREATININE 1.66*  CREAT24HRUR --  MG --  PHOS --  ALBUMIN --  PROT --  ALBUMIN --  AST --  ALT --  ALKPHOS --  BILITOT --  BILIDIR --  IBILI --   Estimated Creatinine Clearance: 25.8 ml/min (by C-G formula based on Cr of 1.66).   Microbiology: Recent Results (from the past 720 hour(s))  URINE CULTURE     Status: Normal   Collection Time   10/28/11 10:10 PM      Component Value Range Status Comment   Specimen Description URINE, CATHETERIZED   Final    Special Requests NONE   Final    Setup Time [id number]   Final    Colony Count NO GROWTH   Final    Culture NO GROWTH   Final    Report Status 10/30/2011 FINAL   Final   MRSA PCR SCREENING     Status: Abnormal   Collection Time   10/29/11  6:19 AM      Component Value Range Status Comment   MRSA by PCR POSITIVE (*) NEGATIVE  Final     Medical History: Past Medical History  Diagnosis Date  . Hyperlipemia   . Diabetes mellitus   . H/O: CVA (cardiovascular accident)     affected left side  . HTN (hypertension)     Medications:  Include Lovenox 40mg  SQ q24h  Assessment: SCr rising, CrCl < 22mL/min  Plan:  Reduce Lovenox to 30mg  SQ daily while CrCl < 30 mL/min  Elie Goody, PharmD  [phone] 11/02/2011 1:02 PM

## 2011-11-02 NOTE — Plan of Care (Signed)
Problem: Phase III Progression Outcomes Goal: Ambulate BID with assist as able Outcome: Not Progressing Patient is 2-person assist and NWB on R leg - L sided weakness due to CVA.  Unable to ambulate; pivot only to/from bed.

## 2011-11-03 LAB — CBC
Hemoglobin: 8.5 g/dL — ABNORMAL LOW (ref 13.0–17.0)
Platelets: 227 10*3/uL (ref 150–400)
RBC: 2.78 MIL/uL — ABNORMAL LOW (ref 4.22–5.81)
WBC: 5.6 10*3/uL (ref 4.0–10.5)

## 2011-11-03 LAB — GLUCOSE, CAPILLARY
Glucose-Capillary: 116 mg/dL — ABNORMAL HIGH (ref 70–99)
Glucose-Capillary: 113 mg/dL — ABNORMAL HIGH (ref 70–99)
Glucose-Capillary: 146 mg/dL — ABNORMAL HIGH (ref 70–99)

## 2011-11-03 LAB — BASIC METABOLIC PANEL
CO2: 24 mEq/L (ref 19–32)
Chloride: 107 mEq/L (ref 96–112)
Glucose, Bld: 101 mg/dL — ABNORMAL HIGH (ref 70–99)
Potassium: 4.2 mEq/L (ref 3.5–5.1)
Sodium: 138 mEq/L (ref 135–145)

## 2011-11-03 MED ORDER — ZOLPIDEM TARTRATE 5 MG PO TABS: 5.0000 mg | ORAL_TABLET | Freq: Every evening | ORAL | Status: AC | PRN

## 2011-11-03 MED ORDER — OXYCODONE HCL 5 MG PO TABS: 5.0000 mg | ORAL_TABLET | ORAL | Status: AC | PRN

## 2011-11-03 NOTE — Progress Notes (Signed)
[facility] has not been able to get Insurance auth for SNF transfer- we have worked diligently with [employer] to no avail. IF we receive call back and auth before 5pm, we will transfer- otherwise, will be for d/c tomorrow- notifying Dr. Aldine Contes- Izola Price to update. Reece Levy, MSW, Theresia Majors [phone]

## 2011-11-03 NOTE — Progress Notes (Signed)
Son has selected SNF bed at [facility] Berkley Harvey is underway- will advise MD of this for ?d/c today. Reece Levy, MSW, Theresia Majors [phone]

## 2011-11-03 NOTE — Discharge Summary (Signed)
Patient ID: [name] MRN: [id number] DOB/AGE: 1919/03/07 77 y.o.  Admit date: 10/28/2011 Discharge date: 11/03/2011  Primary Care Physician:  No primary provider on file.  Discharge Diagnoses:    Present on Admission:  .Fracture acetabulum-closed  Principal Problem:  *Fracture acetabulum-closed Active Problems:  Hypertension  Diabetes mellitus  Hyperlipidemia   Current Discharge Medication List    START taking these medications   Details  oxyCODONE (OXY IR/ROXICODONE) 5 MG immediate release tablet Take 1 tablet (5 mg total) by mouth every 4 (four) hours as needed. Qty: 30 tablet, Refills: 0    zolpidem (AMBIEN) 5 MG tablet Take 1 tablet (5 mg total) by mouth at bedtime as needed for sleep (insomnia). Qty: 30 tablet, Refills: 0      CONTINUE these medications which have NOT CHANGED   Details  amLODipine (NORVASC) 10 MG tablet Take 10 mg by mouth daily.      aspirin 325 MG tablet Take 325 mg by mouth daily.      atorvastatin (LIPITOR) 10 MG tablet Take 10 mg by mouth daily.      fenofibrate micronized (LOFIBRA) 134 MG capsule Take 134 mg by mouth daily before breakfast.      hydrochlorothiazide (MICROZIDE) 12.5 MG capsule Take 12.5 mg by mouth daily.      pioglitazone (ACTOS) 30 MG tablet Take 30 mg by mouth daily.          Disposition and Follow-up: Pt was discharge from the hospital in stable condition. Vitals and labs were reviewed and were stable and at pt's baseline. Please note he is being discharged to SNF of family's choice and will need to be seen in 1 week and have following things checked:  - cbc, please check that Hg/Hct remain stable and at pt's baseline 9-10, he has not had any indications of acute bleeding during the hospitalization and Hg drop was thought to be secondary to dilution from IVF - bmet, check on potassium and creatinine and ensure that it is at pt's baseline Cr 1.6, on discharge Cr was 1.4  Consults:  ortho  Significant  Diagnostic Studies:   Dg Chest 1 View 10/28/2011  IMPRESSION: No evidence of acute cardiopulmonary disease.    Dg Hip Complete Right 10/28/2011  IMPRESSION: Fracture involving the right acetabulum with extension to the right superior pubic ramus.  Severe osteopenia.  Severe osteoarthritis in both hips.    Brief H and P: This is a rather pleasant 77 year old gentleman who was in the kitchen today, he turned to place something on the counter and lost his balance and fell. He fell squarely on his buttock, he had no loss of consciousness, he did not hit his head. He called his son, as he was unable to get up. His son called 911, he was brought to the ER. Reports her chest pains, no dizziness. Patient had a mechanical fall.  Physical Exam on Discharge:  Filed Vitals:   11/02/11 1312 11/02/11 2051 11/03/11 0618 11/03/11 1105  BP: 118/41 115/52 115/52 118/62  Pulse: 84 90 72 71  Temp: 97.9 F (36.6 C) 98.8 F (37.1 C) 98.4 F (36.9 C)   TempSrc: Oral Oral Oral   Resp: 18 19 20    Height:      Weight:      SpO2: 99% 98% 96%      Intake/Output Summary (Last 24 hours) at 11/03/11 1118 Last data filed at 11/03/11 0700  Gross per 24 hour  Intake    830 ml  Output  775 ml  Net     55 ml    General: Alert, awake, oriented x3, in no acute distress. HEENT: No bruits, no goiter. Heart: Regular rate and rhythm, without murmurs, rubs, gallops. Lungs: Clear to auscultation bilaterally. Abdomen: Soft, nontender, nondistended, positive bowel sounds. Extremities: No clubbing cyanosis or edema with positive pedal pulses. Neuro: Grossly intact, nonfocal.  CBC:    Component Value Date/Time   WBC 5.6 11/03/2011 0347   HGB 8.5* 11/03/2011 0347   HCT 25.5* 11/03/2011 0347   PLT 227 11/03/2011 0347   MCV 91.7 11/03/2011 0347   NEUTROABS 3.8 12/18/2010 1133   LYMPHSABS 2.0 12/18/2010 1133   MONOABS 0.7 12/18/2010 1133   EOSABS 0.2 12/18/2010 1133   BASOSABS 0.0 12/18/2010 1133    Basic  Metabolic Panel:    Component Value Date/Time   NA 138 11/03/2011 0347   K 4.2 11/03/2011 0347   CL 107 11/03/2011 0347   CO2 24 11/03/2011 0347   BUN 48* 11/03/2011 0347   CREATININE 1.42* 11/03/2011 0347   GLUCOSE 101* 11/03/2011 0347   CALCIUM 8.8 11/03/2011 0347    Hospital Course:  Principal Problem:  *Fracture acetabulum-closed - ortho consulted and determined that medical and conservative management is appropriate at this time - pt will need to continue physical therapy at SNF - pt has been tolerating pain medication as well and was given prescription upon discharge  Active Problems:  Hypertension - well controlled during the hospitalization   Diabetes mellitus - well controlled during the hospitalization with minimum sliding scale requirements   Hyperlipidemia - well controlled   CKD - creatinine at baseline 1.6, on discharge 1.4 - will need to have BMP checked in 1 week at SNF   Disposition - plan of care and diagnosis, diagnostic studies and test results were discussed with pt and family, son (over the phone) - pt and  family verbalized understanding  Time spent on Discharge: Over 30 minutes  Signed: MAGICK-Ridhi Hoffert 11/03/2011, 11:18 AM

## 2011-11-03 NOTE — Progress Notes (Signed)
Pt rec'd 15 mg Actos @ 0830.  Charted as 30 mg but pt refused full dose b/c states that is what he has been taking at home.

## 2011-11-03 NOTE — Progress Notes (Signed)
CARE MANAGEMENT NOTE 11/03/2011  Patient:  [name], [name]   Account Number:  [id number]  Date Initiated:  10/31/2011  Documentation initiated by:  Jiles Crocker  Subjective/Objective Assessment:   ADMITTED WITH HIP FRACTURE     Action/Plan:   LIVES ALONE; SUPPORTIVE FAMILY MEMBER   Anticipated DC Date:  11/04/2011   Anticipated DC Plan:  SKILLED NURSING FACILITY  In-house referral  Clinical Social Worker      DC Planning Services  NA      [facility] Choice  NA   Choice offered to / List presented to:  NA   DME arranged  NA      DME agency  NA     HH arranged  NA      HH agency  NA   Status of service:  Completed, signed off Medicare Important Message given?   (If response is "NO", the following Medicare IM given date fields will be blank) Date Medicare IM given:   Date Additional Medicare IM given:    Discharge Disposition:  SKILLED NURSING FACILITY  Per UR Regulation:  Reviewed for med. necessity/level of care/duration of stay  Comments:  [id number]/Rhonda Davis,RN,BSN,CCM:  pt dcd to snf  10/31/2011 [facility] RN, BSN, MHA

## 2011-11-04 LAB — GLUCOSE, CAPILLARY: Glucose-Capillary: 114 mg/dL — ABNORMAL HIGH (ref 70–99)

## 2011-11-04 MED ORDER — PIOGLITAZONE HCL 15 MG PO TABS
15.0000 mg | ORAL_TABLET | Freq: Every day | ORAL | Status: DC
  Administered 2011-11-04: 15 mg via ORAL

## 2011-11-04 MED ORDER — LORAZEPAM 1 MG PO TABS: 1.0000 mg | ORAL_TABLET | ORAL | Status: DC | PRN

## 2011-11-04 MED ORDER — LORAZEPAM 1 MG PO TABS
1.0000 mg | ORAL_TABLET | Freq: Once | ORAL | Status: AC
  Administered 2011-11-04: 1 mg via ORAL
  Filled 2011-11-04: qty 1

## 2011-11-04 MED ORDER — PIOGLITAZONE HCL 30 MG PO TABS: 15.0000 mg | ORAL_TABLET | Freq: Every day | ORAL | Status: DC

## 2011-11-04 NOTE — Progress Notes (Signed)
Report called to [facility] @ [phone].  PTAR; son following.

## 2011-11-04 NOTE — Plan of Care (Signed)
Problem: Phase III Progression Outcomes Goal: Foley discontinued Outcome: Completed/Met Date Met:  11/04/11 DC'd 11/03/11 @ ca. 1430.

## 2011-11-04 NOTE — Plan of Care (Signed)
Problem: Phase III Progression Outcomes Goal: Voiding independently Outcome: Not Progressing Condom, cath b/c unable to ambulate

## 2011-11-04 NOTE — Progress Notes (Signed)
Pt. Given pain medication, and Ambien 5mg  for sleep at 2145.  Since that time, Pt. Has been awake, calling for assistance Q15 minutes.  Seems to be having anxiety, agitation instead of sleep from Ambien.  Called on-call provider for [facility], got order for 1-time PO Ativan (1 mg).  This has been given, will evaluate Pt's. Response to this. Candiss Norse, RN  [phone] 11/04/11

## 2011-11-04 NOTE — Discharge Summary (Signed)
Patient ID: [name] MRN: [id number] DOB/AGE: 11-24-1918 77 y.o.  Admit date: 10/28/2011 Discharge date: 11/04/2011  Primary Care Physician:  No primary provider on file.  Discharge Diagnoses:    Present on Admission:  .Fracture acetabulum-closed  Principal Problem:  *Fracture acetabulum-closed Active Problems:  Hypertension  Diabetes mellitus  Hyperlipidemia   Current Discharge Medication List    START taking these medications   Details  oxyCODONE (OXY IR/ROXICODONE) 5 MG immediate release tablet Take 1 tablet (5 mg total) by mouth every 4 (four) hours as needed. Qty: 30 tablet, Refills: 0    zolpidem (AMBIEN) 5 MG tablet Take 1 tablet (5 mg total) by mouth at bedtime as needed for sleep (insomnia). Qty: 30 tablet, Refills: 0      CONTINUE these medications which have CHANGED   Details  pioglitazone (ACTOS) 30 MG tablet Take 0.5 tablets (15 mg total) by mouth daily. Qty: 31 tablet, Refills: 1      CONTINUE these medications which have NOT CHANGED   Details  amLODipine (NORVASC) 10 MG tablet Take 10 mg by mouth daily.      aspirin 325 MG tablet Take 325 mg by mouth daily.      atorvastatin (LIPITOR) 10 MG tablet Take 10 mg by mouth daily.      fenofibrate micronized (LOFIBRA) 134 MG capsule Take 134 mg by mouth daily before breakfast.      hydrochlorothiazide (MICROZIDE) 12.5 MG capsule Take 12.5 mg by mouth daily.          Disposition and Follow-up: Pt was discharge from the hospital in stable condition. Vitals and labs were reviewed and were stable and at pt's baseline. Please note he is being discharged to SNF of family's choice and will need to be seen in 1 week and have following things checked:  - cbc, please check that Hg/Hct remain stable and at pt's baseline 9-10, he has not had any indications of acute bleeding during the hospitalization and Hg drop was thought to be secondary to dilution from IVF  - bmet, check on potassium and creatinine and  ensure that it is at pt's baseline Cr 1.6, on discharge Cr was 1.4  Consults: ortho   Significant Diagnostic Studies:  Dg Chest 1 View 10/28/2011 IMPRESSION: No evidence of acute cardiopulmonary disease.  Original Report Authenticated By: Charline Bills, M.D.   Dg Hip Complete Right 10/28/2011  IMPRESSION: Fracture involving the right acetabulum with extension to the right superior pubic ramus.  Severe osteopenia.  Severe osteoarthritis in both hips.    Brief H and P: This is a rather pleasant 77 year old gentleman who was in the kitchen today, he turned to place something on the counter and lost his balance and fell. He fell squarely on his buttock, he had no loss of consciousness, he did not hit his head. He called his son, as he was unable to get up. His son called 911, he was brought to the ER. Reports her chest pains, no dizziness. Patient had a mechanical fall.   Physical Exam on Discharge:  Filed Vitals:   11/03/11 1105 11/03/11 1433 11/03/11 2205 11/04/11 1044  BP: 118/62 122/47 120/62 116/64  Pulse: 71 92 93   Temp:  97.9 F (36.6 C) 98.8 F (37.1 C)   TempSrc:   Oral   Resp:  18 16   Height:      Weight:      SpO2:  97% 95%      Intake/Output Summary (Last 24 hours) at  11/04/11 1227 Last data filed at 11/04/11 0900  Gross per 24 hour  Intake   1200 ml  Output   1205 ml  Net     -5 ml    General: Alert, awake, oriented x3, in no acute distress. HEENT: No bruits, no goiter. Heart: Regular rate and rhythm, without murmurs, rubs, gallops. Lungs: Clear to auscultation bilaterally. Abdomen: Soft, nontender, nondistended, positive bowel sounds. Extremities: No clubbing cyanosis or edema with positive pedal pulses. Neuro: Grossly intact, nonfocal.  CBC:    Component Value Date/Time   WBC 5.6 11/03/2011 0347   HGB 8.5* 11/03/2011 0347   HCT 25.5* 11/03/2011 0347   PLT 227 11/03/2011 0347   MCV 91.7 11/03/2011 0347   NEUTROABS 3.8 12/18/2010 1133   LYMPHSABS  2.0 12/18/2010 1133   MONOABS 0.7 12/18/2010 1133   EOSABS 0.2 12/18/2010 1133   BASOSABS 0.0 12/18/2010 1133    Basic Metabolic Panel:    Component Value Date/Time   NA 138 11/03/2011 0347   K 4.2 11/03/2011 0347   CL 107 11/03/2011 0347   CO2 24 11/03/2011 0347   BUN 48* 11/03/2011 0347   CREATININE 1.42* 11/03/2011 0347   GLUCOSE 101* 11/03/2011 0347   CALCIUM 8.8 11/03/2011 0347    Hospital Course:   Principal Problem:  *Fracture acetabulum-closed  - ortho consulted and determined that medical and conservative management is appropriate at this time  - pt will need to continue physical therapy at SNF  - pt has been tolerating pain medication as well and was given prescription upon discharge  Active Problems:  Hypertension  - well controlled during the hospitalization  Diabetes mellitus  - well controlled during the hospitalization with minimum sliding scale requirements  Hyperlipidemia  - well controlled  CKD  - creatinine at baseline 1.6, on discharge 1.4  - will need to have BMP checked in 1 week at SNF  Disposition  - plan of care and diagnosis, diagnostic studies and test results were discussed with pt and family, son (over the phone)  - pt and family verbalized understanding  Time spent on Discharge: Over 30 minutes  Signed: Debbora Presto 11/04/2011, 12:27 PM

## 2011-11-04 NOTE — Plan of Care (Signed)
Problem: Phase II Progression Outcomes Goal: Discharge plan established Outcome: Completed/Met Date Met:  11/04/11 [facility] when insurance is verified.

## 2012-02-23 NOTE — Consult Note (Signed)
I have reviewed and discussed the care of this patient in detail with the nurse practitioner including pertinent patient records, physical exam findings and data. I agree with details of this encounter.  

## 2012-11-09 ENCOUNTER — Emergency Department (HOSPITAL_COMMUNITY): Payer: PRIVATE HEALTH INSURANCE

## 2012-11-09 ENCOUNTER — Encounter (HOSPITAL_COMMUNITY): Payer: Self-pay | Admitting: *Deleted

## 2012-11-09 ENCOUNTER — Emergency Department (HOSPITAL_COMMUNITY)
Admission: EM | Admit: 2012-11-09 | Discharge: 2012-11-09 | Disposition: A | Discharge status: Skilled Nursing Facility | Payer: PRIVATE HEALTH INSURANCE | Attending: Emergency Medicine | Admitting: Emergency Medicine

## 2012-11-09 DIAGNOSIS — Z79899 Other long term (current) drug therapy: Secondary | ICD-10-CM | POA: Insufficient documentation

## 2012-11-09 DIAGNOSIS — S12100A Unspecified displaced fracture of second cervical vertebra, initial encounter for closed fracture: Secondary | ICD-10-CM | POA: Insufficient documentation

## 2012-11-09 DIAGNOSIS — M949 Disorder of cartilage, unspecified: Secondary | ICD-10-CM | POA: Insufficient documentation

## 2012-11-09 DIAGNOSIS — Y9210 Unspecified residential institution as the place of occurrence of the external cause: Secondary | ICD-10-CM | POA: Insufficient documentation

## 2012-11-09 DIAGNOSIS — I10 Essential (primary) hypertension: Secondary | ICD-10-CM | POA: Insufficient documentation

## 2012-11-09 DIAGNOSIS — M899 Disorder of bone, unspecified: Secondary | ICD-10-CM | POA: Insufficient documentation

## 2012-11-09 DIAGNOSIS — Z8679 Personal history of other diseases of the circulatory system: Secondary | ICD-10-CM | POA: Insufficient documentation

## 2012-11-09 DIAGNOSIS — S32009A Unspecified fracture of unspecified lumbar vertebra, initial encounter for closed fracture: Secondary | ICD-10-CM | POA: Insufficient documentation

## 2012-11-09 DIAGNOSIS — Y939 Activity, unspecified: Secondary | ICD-10-CM | POA: Insufficient documentation

## 2012-11-09 DIAGNOSIS — IMO0002 Reserved for concepts with insufficient information to code with codable children: Secondary | ICD-10-CM | POA: Insufficient documentation

## 2012-11-09 DIAGNOSIS — M6281 Muscle weakness (generalized): Secondary | ICD-10-CM | POA: Insufficient documentation

## 2012-11-09 DIAGNOSIS — Z791 Long term (current) use of non-steroidal anti-inflammatories (NSAID): Secondary | ICD-10-CM | POA: Insufficient documentation

## 2012-11-09 DIAGNOSIS — Z23 Encounter for immunization: Secondary | ICD-10-CM | POA: Insufficient documentation

## 2012-11-09 DIAGNOSIS — R911 Solitary pulmonary nodule: Secondary | ICD-10-CM

## 2012-11-09 DIAGNOSIS — W010XXA Fall on same level from slipping, tripping and stumbling without subsequent striking against object, initial encounter: Secondary | ICD-10-CM | POA: Insufficient documentation

## 2012-11-09 DIAGNOSIS — S12112G Nondisplaced Type II dens fracture, subsequent encounter for fracture with delayed healing: Secondary | ICD-10-CM

## 2012-11-09 DIAGNOSIS — W1809XA Striking against other object with subsequent fall, initial encounter: Secondary | ICD-10-CM | POA: Insufficient documentation

## 2012-11-09 DIAGNOSIS — E119 Type 2 diabetes mellitus without complications: Secondary | ICD-10-CM | POA: Insufficient documentation

## 2012-11-09 DIAGNOSIS — Z7982 Long term (current) use of aspirin: Secondary | ICD-10-CM | POA: Insufficient documentation

## 2012-11-09 DIAGNOSIS — Z8781 Personal history of (healed) traumatic fracture: Secondary | ICD-10-CM | POA: Insufficient documentation

## 2012-11-09 DIAGNOSIS — R918 Other nonspecific abnormal finding of lung field: Secondary | ICD-10-CM | POA: Insufficient documentation

## 2012-11-09 DIAGNOSIS — I69998 Other sequelae following unspecified cerebrovascular disease: Secondary | ICD-10-CM | POA: Insufficient documentation

## 2012-11-09 DIAGNOSIS — E785 Hyperlipidemia, unspecified: Secondary | ICD-10-CM | POA: Insufficient documentation

## 2012-11-09 MED ORDER — TETANUS-DIPHTH-ACELL PERTUSSIS 5-2.5-18.5 LF-MCG/0.5 IM SUSP
0.5000 mL | Freq: Once | INTRAMUSCULAR | Status: AC
  Administered 2012-11-09: 0.5 mL via INTRAMUSCULAR
  Filled 2012-11-09: qty 0.5

## 2012-11-09 NOTE — ED Notes (Signed)
Bed:WA18<BR> Expected date:<BR> Expected time:<BR> Means of arrival:<BR> Comments:<BR> EMS

## 2012-11-09 NOTE — ED Provider Notes (Signed)
History     CSN: [id number]  Arrival date & time 11/09/12  2042   First MD Initiated Contact with Patient 11/09/12 2051      Chief Complaint  Patient presents with  . Fall    (Consider location/radiation/quality/duration/timing/severity/associated sxs/prior treatment) The history is provided by the patient.  [name] is a 78 y.o. male hx of HTN, DM, CVA with residual L sided weakness here with s/p fall. He was in the bathroom and slipped and fell. He hit his head on the toilet. No syncope or LOC. He lives at an assisted living and usually walks with a walker. He had some scalp and bilateral elbow abrasions that stopped bleeding. Unknown tetatnus vaccination. Denies fevers, chills.    Past Medical History  Diagnosis Date  . Hyperlipemia   . Diabetes mellitus   . H/O: CVA (cardiovascular accident)     affected left side  . HTN (hypertension)     Past Surgical History  Procedure Date  . Transurethral resection of prostate   . Cataract extraction w/ intraocular lens implant     No family history on file.  History  Substance Use Topics  . Smoking status: Never Smoker   . Smokeless tobacco: Not on file  . Alcohol Use: No      Review of Systems  Skin: Positive for wound.  All other systems reviewed and are negative.    Allergies  Review of patient's allergies indicates no known allergies.  Home Medications   Current Outpatient Rx  Name  Route  Sig  Dispense  Refill  . ACETAMINOPHEN 500 MG PO TABS   Oral   Take 1,000 mg by mouth every 6 (six) hours as needed. For pain         . AMLODIPINE BESYLATE 10 MG PO TABS   Oral   Take 10 mg by mouth daily.           . ASPIRIN 81 MG PO CHEW   Oral   Chew 81 mg by mouth daily.         . ATORVASTATIN CALCIUM 10 MG PO TABS   Oral   Take 10 mg by mouth daily.           Marland Kitchen BISACODYL 5 MG PO TBEC   Oral   Take 5 mg by mouth every other day.         . FENOFIBRATE MICRONIZED 134 MG PO CAPS   Oral    Take 134 mg by mouth daily before breakfast.           . HYDROCHLOROTHIAZIDE 12.5 MG PO CAPS   Oral   Take 12.5 mg by mouth daily.           . TAB-A-VITE/IRON PO TABS   Oral   Take 1 tablet by mouth daily.         . OXYCODONE HCL 5 MG PO TABS   Oral   Take 5 mg by mouth every 12 (twelve) hours as needed. For pain         . PIOGLITAZONE HCL 15 MG PO TABS   Oral   Take 15 mg by mouth daily.         Marland Kitchen POLYETHYLENE GLYCOL 3350 PO PACK   Oral   Take 17 g by mouth daily.         Marland Kitchen ZOLPIDEM TARTRATE 5 MG PO TABS   Oral   Take 5 mg by mouth at bedtime. scheduled         .  ZOLPIDEM TARTRATE 5 MG PO TABS   Oral   Take 1 tablet (5 mg total) by mouth at bedtime as needed for sleep (insomnia).   30 tablet   0     BP 142/58  Pulse 82  Temp 97.4 F (36.3 C) (Oral)  Resp 18  SpO2 96%  Physical Exam  Nursing note and vitals reviewed. Constitutional: He is oriented to person, place, and time. He appears well-developed.       NAD   HENT:  Head: Normocephalic.  Mouth/Throat: Oropharynx is clear and moist.       Posterior scalp abrasion. No hematoma.   Eyes: Conjunctivae normal are normal. Pupils are equal, round, and reactive to light.  Neck: Normal range of motion.       C collar in place, no tenderness   Cardiovascular: Normal rate, regular rhythm and normal heart sounds.   Pulmonary/Chest: Effort normal and breath sounds normal. No respiratory distress. He has no wheezes. He has no rales. He exhibits no tenderness.  Abdominal: Soft. Bowel sounds are normal. He exhibits no distension. There is no tenderness. There is no rebound.       Pelvis stable   Musculoskeletal: Normal range of motion.       Abrasions bilateral elbows, but elbows nontender and have nl ROM.   Neurological: He is alert and oriented to person, place, and time.       Strength 4/5 L side (chronic after stroke), 5/5 R side.   Skin: Skin is warm and dry.       Abrasions on posterior scalp and  bilateral elbows noted   Psychiatric: He has a normal mood and affect. His behavior is normal. Judgment and thought content normal.    ED Course  Procedures (including critical care time)  Labs Reviewed - No data to display Dg Chest 1 View  11/09/2012  *RADIOLOGY REPORT*  Clinical Data: Fall, hypertension.  CHEST - 1 VIEW  Comparison: 10/28/2011  Findings: Prominent cardiomediastinal contours, similar to prior allowing for differences in technique and positioning. Aortic atherosclerosis.  There is mild elevation of the left to the diaphragm and mild lung base opacities.  Background coarse interstitial markings. Questionable 9 mm right upper lobe nodule. No definite pleural effusion or pneumothorax.  Diffuse osteopenia. No displaced fracture identified.  IMPRESSION: Prominent cardiomediastinal contours are similar to prior.  Mild left lung base opacity; atelectasis versus infiltrate, superimposed on chronic interstitial markings.  Questionable right upper lobe nodule.  Recommend follow-up PA and lateral views when the patient can tolerate for better characterization.   Original Report Authenticated By: Jearld Lesch, M.D.    Dg Lumbar Spine Complete  11/09/2012  *RADIOLOGY REPORT*  Clinical Data: Fall.  Low back pain.  LUMBAR SPINE - COMPLETE 4+ VIEW  Comparison: Most recent comparison is 03/19/2004.  Findings: Multiple compression fractures are present which are age indeterminant.  L1 shows 50% loss of anterior vertebral body height.  L4 superior endplate compression fracture shows about 50% loss of vertebral body height.  Multilevel degenerative disease is present.  L2 shows about 25% loss of superior endplate vertebral body height.  There is mild retropulsion at L4.  Multilevel facet arthrosis is present.  Multilevel degenerative disc disease also present.  Severe atherosclerosis.  Osteopenia.  Dextroconvex thoracolumbar curvature with the apex at T12-L1.  IMPRESSION: L1, L2 and L4 compression  fractures are present which are age indeterminant.  These are all new compared to 2005.  There is partial visualization of the  L4 fracture on the pelvis from last year making this probably chronic.  Consider follow-up MRI to determine the age of the fractures in the absence of comparison examinations.   Original Report Authenticated By: Andreas Newport, M.D.    Dg Pelvis 1-2 Views  11/09/2012  *RADIOLOGY REPORT*  Clinical Data: Fall.  Back pain.  PELVIS - 1-2 VIEW  Comparison: 10/31/2011.  Findings: Diffuse osteopenia is present. Deformity of the right obturator ring is present, compatible with healed or healing fractures seen on prior CT.  Extensive atherosclerotic calcification is present in the iliofemoral system.  No acute displaced fracture is identified.  Sacrum is obscured by overlying bowel gas.  IMPRESSION: No definite acute osseous abnormality.  Severe osteopenia.  Healing or healed right obturator ring fractures.   Original Report Authenticated By: Andreas Newport, M.D.    Ct Head Wo Contrast  11/09/2012  *RADIOLOGY REPORT*  Clinical Data:  Fall, head trauma.  CT HEAD WITHOUT CONTRAST CT CERVICAL SPINE WITHOUT CONTRAST  Technique:  Multidetector CT imaging of the head and cervical spine was performed following the standard protocol without intravenous contrast.  Multiplanar CT image reconstructions of the cervical spine were also generated.  Comparison:  12/18/2010 head CT  CT HEAD  Findings: Prominence of the sulci, cisterns, and ventricles, in keeping with volume loss. There are subcortical and periventricular white matter hypodensities, a nonspecific finding most often seen with chronic microangiopathic changes.  There is no evidence for acute hemorrhage, overt hydrocephalus, mass lesion, or abnormal extra-axial fluid collection.  No definite CT evidence for acute cortical based (large artery) infarction. Atherosclerotic vascular calcification remote basal ganglia lacunar infarctions.  Chronic  paranasal sinus and right mastoid air cell opacification.  No displaced calvarial fracture.  IMPRESSION: Volume loss white matter changes are similar to prior.  No CT evidence for acute intracranial abnormality.  Chronic sinus disease.  CT CERVICAL SPINE  Findings: The lung apices are clear.  Diffuse osteopenia. Multilevel degenerative changes. There is a type 2 dens fracture with the tip the dens and craniocervical junction displaced 5 mm posteriorly relative to the base of C2. In retrospect, the appearance is unchanged from the prior head CT. There is no prevertebral soft tissue swelling. Vertebral body height 11 otherwise maintained.  IMPRESSION: Remote type 2 dens fracture without osseous bridging.  There is 5 mm of posterior displacement of the tip of the dens and craniocervical junction.  Discussed via telephone with Dr. Silverio Lay at 09:40 p.m. on 11/09/2012.   Original Report Authenticated By: Jearld Lesch, M.D.    Ct Cervical Spine Wo Contrast  11/09/2012  *RADIOLOGY REPORT*  Clinical Data:  Fall, head trauma.  CT HEAD WITHOUT CONTRAST CT CERVICAL SPINE WITHOUT CONTRAST  Technique:  Multidetector CT imaging of the head and cervical spine was performed following the standard protocol without intravenous contrast.  Multiplanar CT image reconstructions of the cervical spine were also generated.  Comparison:  12/18/2010 head CT  CT HEAD  Findings: Prominence of the sulci, cisterns, and ventricles, in keeping with volume loss. There are subcortical and periventricular white matter hypodensities, a nonspecific finding most often seen with chronic microangiopathic changes.  There is no evidence for acute hemorrhage, overt hydrocephalus, mass lesion, or abnormal extra-axial fluid collection.  No definite CT evidence for acute cortical based (large artery) infarction. Atherosclerotic vascular calcification remote basal ganglia lacunar infarctions.  Chronic paranasal sinus and right mastoid air cell opacification.   No displaced calvarial fracture.  IMPRESSION: Volume loss white matter changes are  similar to prior.  No CT evidence for acute intracranial abnormality.  Chronic sinus disease.  CT CERVICAL SPINE  Findings: The lung apices are clear.  Diffuse osteopenia. Multilevel degenerative changes. There is a type 2 dens fracture with the tip the dens and craniocervical junction displaced 5 mm posteriorly relative to the base of C2. In retrospect, the appearance is unchanged from the prior head CT. There is no prevertebral soft tissue swelling. Vertebral body height 11 otherwise maintained.  IMPRESSION: Remote type 2 dens fracture without osseous bridging.  There is 5 mm of posterior displacement of the tip of the dens and craniocervical junction.  Discussed via telephone with Dr. Silverio Lay at 09:40 p.m. on 11/09/2012.   Original Report Authenticated By: Jearld Lesch, M.D.      No diagnosis found.    MDM  [name] is a 78 y.o. male here s/p fall. Will do CT head/neck. Will update tetanus.    10:48 PM CT showed type 2 dens fracture, likely old. I called neurosurgery, Dr. Wynetta Emery, who reviewed the images. He feels that the fracture is likely old. Recommend soft collar and outpatient f/u. He also has a possible lung nodule that can be followed up outpatient and some vertebral fractures with unclear age. He is moving all extremities and not have any neuro deficits to suggest spinal trauma. Will recommend continuing oxycodone as needed for pain.          Richardean Canal, MD 11/09/12 2251

## 2012-11-09 NOTE — ED Notes (Signed)
Patient given discharge instructions, information, prescriptions, and diet order. Patient states that they adequately understand discharge information given and to return to ED if symptoms return or worsen.     

## 2012-11-09 NOTE — ED Notes (Signed)
[facility] called for transport.  

## 2012-11-09 NOTE — ED Notes (Signed)
Per EMS: patient from [facility] with c/o fall. Patient fell from standing position approximately 1 hour ago- EMS sts that patient did hit his head when he fell. No disorientation at this time. Patient has abrasion to posterior head and skin tears to posterior elbows. Bleeding controled at this time. Patient in C collar at this time.

## 2013-01-21 ENCOUNTER — Encounter (HOSPITAL_COMMUNITY): Payer: Self-pay | Admitting: Emergency Medicine

## 2013-01-21 ENCOUNTER — Emergency Department (HOSPITAL_COMMUNITY): Payer: Medicare Other

## 2013-01-21 ENCOUNTER — Emergency Department (HOSPITAL_COMMUNITY)
Admission: EM | Admit: 2013-01-21 | Discharge: 2013-01-22 | Disposition: A | Discharge status: Skilled Nursing Facility | Payer: Medicare Other | Attending: Emergency Medicine | Admitting: Emergency Medicine

## 2013-01-21 DIAGNOSIS — Z7982 Long term (current) use of aspirin: Secondary | ICD-10-CM | POA: Insufficient documentation

## 2013-01-21 DIAGNOSIS — Z79899 Other long term (current) drug therapy: Secondary | ICD-10-CM | POA: Insufficient documentation

## 2013-01-21 DIAGNOSIS — Y9301 Activity, walking, marching and hiking: Secondary | ICD-10-CM | POA: Insufficient documentation

## 2013-01-21 DIAGNOSIS — E119 Type 2 diabetes mellitus without complications: Secondary | ICD-10-CM | POA: Insufficient documentation

## 2013-01-21 DIAGNOSIS — S51809A Unspecified open wound of unspecified forearm, initial encounter: Secondary | ICD-10-CM | POA: Insufficient documentation

## 2013-01-21 DIAGNOSIS — Y9210 Unspecified residential institution as the place of occurrence of the external cause: Secondary | ICD-10-CM | POA: Insufficient documentation

## 2013-01-21 DIAGNOSIS — S0100XA Unspecified open wound of scalp, initial encounter: Secondary | ICD-10-CM | POA: Insufficient documentation

## 2013-01-21 DIAGNOSIS — E785 Hyperlipidemia, unspecified: Secondary | ICD-10-CM | POA: Insufficient documentation

## 2013-01-21 DIAGNOSIS — W1809XA Striking against other object with subsequent fall, initial encounter: Secondary | ICD-10-CM | POA: Insufficient documentation

## 2013-01-21 DIAGNOSIS — I10 Essential (primary) hypertension: Secondary | ICD-10-CM | POA: Insufficient documentation

## 2013-01-21 DIAGNOSIS — Z8673 Personal history of transient ischemic attack (TIA), and cerebral infarction without residual deficits: Secondary | ICD-10-CM | POA: Insufficient documentation

## 2013-01-21 NOTE — ED Provider Notes (Addendum)
History     CSN: [id number]  Arrival date & time 01/21/13  2107   First MD Initiated Contact with Patient 01/21/13 2118      Chief Complaint  Patient presents with  . Fall    (Consider location/radiation/quality/duration/timing/severity/associated sxs/prior treatment) Patient is a 79 y.o. male presenting with fall. The history is provided by the patient and a relative.  Fall The accident occurred less than 1 hour ago. The fall occurred while walking (coming out of the bathroom and turned the wrong way and lost his balance). He fell from a height of 3 to 5 ft. He landed on a hard floor. The point of impact was the head (right forearm and knee). Pain location: denies pain. The pain is at a severity of 0/10. The patient is experiencing no pain. He was ambulatory at the scene. Pertinent negatives include no visual change, no abdominal pain, no vomiting, no headaches and no loss of consciousness. Exacerbated by: nothing. Treatment on scene includes a backboard. He has tried nothing for the symptoms. The treatment provided no relief.    Past Medical History  Diagnosis Date  . Hyperlipemia   . Diabetes mellitus   . H/O: CVA (cardiovascular accident)     affected left side  . HTN (hypertension)     Past Surgical History  Procedure Laterality Date  . Transurethral resection of prostate    . Cataract extraction w/ intraocular lens implant      No family history on file.  History  Substance Use Topics  . Smoking status: Never Smoker   . Smokeless tobacco: Not on file  . Alcohol Use: No      Review of Systems  Gastrointestinal: Negative for vomiting and abdominal pain.  Neurological: Negative for loss of consciousness and headaches.  All other systems reviewed and are negative.    Allergies  Review of patient's allergies indicates no known allergies.  Home Medications   Current Outpatient Rx  Name  Route  Sig  Dispense  Refill  . acetaminophen (MAPAP) 500 MG tablet  Oral   Take 1,000 mg by mouth every 6 (six) hours as needed. For pain         . amLODipine (NORVASC) 10 MG tablet   Oral   Take 10 mg by mouth daily.           Marland Kitchen aspirin 81 MG chewable tablet   Oral   Chew 81 mg by mouth daily.         Marland Kitchen atorvastatin (LIPITOR) 10 MG tablet   Oral   Take 10 mg by mouth daily.           . bisacodyl (DULCOLAX) 5 MG EC tablet   Oral   Take 5 mg by mouth every other day.         . fenofibrate micronized (LOFIBRA) 134 MG capsule   Oral   Take 134 mg by mouth daily before breakfast.           . hydrochlorothiazide (MICROZIDE) 12.5 MG capsule   Oral   Take 12.5 mg by mouth daily.           . Multiple Vitamins-Iron (MULTIVITAMINS WITH IRON) TABS   Oral   Take 1 tablet by mouth daily.         Marland Kitchen oxyCODONE (OXY IR/ROXICODONE) 5 MG immediate release tablet   Oral   Take 5 mg by mouth every 12 (twelve) hours as needed. For pain         .  pioglitazone (ACTOS) 15 MG tablet   Oral   Take 15 mg by mouth daily.         . polyethylene glycol (MIRALAX / GLYCOLAX) packet   Oral   Take 17 g by mouth daily.         Marland Kitchen EXPIRED: zolpidem (AMBIEN) 5 MG tablet   Oral   Take 1 tablet (5 mg total) by mouth at bedtime as needed for sleep (insomnia).   30 tablet   0   . zolpidem (AMBIEN) 5 MG tablet   Oral   Take 5 mg by mouth at bedtime. scheduled           BP 141/62  Pulse 76  Temp(Src) 97.9 F (36.6 C) (Oral)  Wt 169 lb (76.658 kg)  BMI 27.29 kg/m2  SpO2 99%  Physical Exam  Nursing note and vitals reviewed. Constitutional: He is oriented to person, place, and time. He appears well-developed and well-nourished. No distress.  HENT:  Head: Normocephalic. Head is with abrasion and with laceration.    Mouth/Throat: Oropharynx is clear and moist.  Eyes: Conjunctivae and EOM are normal. Pupils are equal, round, and reactive to light.  Neck: Normal range of motion. Neck supple. No spinous process tenderness and no muscular  tenderness present.  Cardiovascular: Normal rate, regular rhythm and intact distal pulses.   No murmur heard. Pulmonary/Chest: Effort normal and breath sounds normal. No respiratory distress. He has no wheezes. He has no rales.  Abdominal: Soft. He exhibits no distension. There is no tenderness. There is no rebound and no guarding.  Musculoskeletal: Normal range of motion. He exhibits no edema and no tenderness.  Neurological: He is alert and oriented to person, place, and time. He has normal strength. No sensory deficit.  Skin: Skin is warm and dry. No rash noted. No erythema.     Psychiatric: He has a normal mood and affect. His behavior is normal.    ED Course  Procedures (including critical care time)  Labs Reviewed - No data to display Ct Head Wo Contrast  01/21/2013  *RADIOLOGY REPORT*  Clinical Data:  Fall, scalp laceration  CT HEAD WITHOUT CONTRAST CT CERVICAL SPINE WITHOUT CONTRAST  Technique:  Multidetector CT imaging of the head and cervical spine was performed following the standard protocol without intravenous contrast.  Multiplanar CT image reconstructions of the cervical spine were also generated.  Comparison:  Head CT 11/09/2012  CT HEAD  Findings: There is no intracranial hemorrhage.  There is extensive diffuse atrophy and mild ventricular dilatation.  There is periventricular white matter hypodensities.  No CT evidence of acute infarction.  There is high-density material within the left and right frontal sinuses which is new from prior.  Favor this to represent inspissated inflammatory material rather than hemorrhage.  (Image 15).  There is similar material within the left and right maxillary sinus with chronic bony changes which are similar to prior exam.  Orbits are normal.  No evidence of skull base fracture.  There is chronic sclerosis of the right mastoid air cells.  IMPRESSION:  1.  No evidence of intracranial trauma.  2.  Severe cortical atrophy and microvascular disease. 3.   Chronic inspissated material within the maxillary sinus.  New high density material within the frontal sinus is felt to relate to inflammatory extension from the maxillary sinuses rather than hemorrhage.  CT CERVICAL SPINE  There is a chronic fracture of the odontoid process of the C2 vertebral body with 2 mm of the posterior  subluxation not changed from prior.  There is no evidence of acute fracture of cervical spine.  There is multiple levels of endplate degeneration and spurring.  Normal facet articulation.  Normal craniocervical junction.  No evidence epidural or paraspinal hematoma.   IMPRESSION:  1. 1.  Chronic type 2 dens fracture not changed from 10/22/2012.  2.  No evidence of acute fracture of the cervical spine. 3.  Multilevel disc osteophytic disease.   Original Report Authenticated By: Genevive Bi, M.D.    Ct Cervical Spine Wo Contrast  01/21/2013  *RADIOLOGY REPORT*  Clinical Data:  Fall, scalp laceration  CT HEAD WITHOUT CONTRAST CT CERVICAL SPINE WITHOUT CONTRAST  Technique:  Multidetector CT imaging of the head and cervical spine was performed following the standard protocol without intravenous contrast.  Multiplanar CT image reconstructions of the cervical spine were also generated.  Comparison:  Head CT 11/09/2012  CT HEAD  Findings: There is no intracranial hemorrhage.  There is extensive diffuse atrophy and mild ventricular dilatation.  There is periventricular white matter hypodensities.  No CT evidence of acute infarction.  There is high-density material within the left and right frontal sinuses which is new from prior.  Favor this to represent inspissated inflammatory material rather than hemorrhage.  (Image 15).  There is similar material within the left and right maxillary sinus with chronic bony changes which are similar to prior exam.  Orbits are normal.  No evidence of skull base fracture.  There is chronic sclerosis of the right mastoid air cells.  IMPRESSION:  1.  No evidence of  intracranial trauma.  2.  Severe cortical atrophy and microvascular disease. 3.  Chronic inspissated material within the maxillary sinus.  New high density material within the frontal sinus is felt to relate to inflammatory extension from the maxillary sinuses rather than hemorrhage.  CT CERVICAL SPINE  There is a chronic fracture of the odontoid process of the C2 vertebral body with 2 mm of the posterior subluxation not changed from prior.  There is no evidence of acute fracture of cervical spine.  There is multiple levels of endplate degeneration and spurring.  Normal facet articulation.  Normal craniocervical junction.  No evidence epidural or paraspinal hematoma.   IMPRESSION:  1. 1.  Chronic type 2 dens fracture not changed from 10/22/2012.  2.  No evidence of acute fracture of the cervical spine. 3.  Multilevel disc osteophytic disease.   Original Report Authenticated By: Genevive Bi, M.D.    LACERATION REPAIR Performed by: Gwyneth Sprout Authorized by: Gwyneth Sprout Consent: Verbal consent obtained. Risks and benefits: risks, benefits and alternatives were discussed Consent given by: patient Patient identity confirmed: provided demographic data Prepped and Draped in normal sterile fashion Wound explored  Laceration Location: scalp  Laceration Length: 3cm  No Foreign Bodies seen or palpated  Anesthesia: local infiltration  Local anesthetic: lidocaine 1% with epinephrine  Anesthetic total: 4 ml  Irrigation method: syringe Amount of cleaning: standard  Skin closure: staples  Number of sutures: 3  Technique: staples  Patient tolerance: Patient tolerated the procedure well with no immediate complications.   1. Fall at nursing home, initial encounter   2. Scalp laceration, initial encounter   3. Skin tear of forearm without complication, right, initial encounter       MDM   Patient with a mechanical fall tonight after he went to the bathroom. He hit his head and  has a laceration and multiple skin tears is red as well as his right forearm and  knee. He is able to walk after the fall and has complete range of motion of bilateral hips, shoulders and elbows. He denies any neck pain however he had prior slow healing type II dens fracture and given injury to his head we'll CT his head and C-spine. Normal mentation currently and not on anticoagulation.  Tetanus shot UTD.  10:19 PM CT without acute changes.  Laceration repaired and pt d/ced home.       Gwyneth Sprout, MD 01/21/13 0865  Gwyneth Sprout, MD 01/21/13 2235

## 2013-01-21 NOTE — ED Notes (Signed)
Patient from [facility] fell tonight and has several small abrasions on the back of his head and a skin tear on right forearm.  No LOC per patient.  Fall was witnessed.  Patient alert and oriented now per staff and son.

## 2013-01-21 NOTE — ED Notes (Signed)
Awaiting [facility]. 

## 2013-01-21 NOTE — ED Notes (Signed)
[id number]> Expected date:01/21/13<BR> Expected time: 8:58 PM<BR> Means of arrival:Ambulance<BR> Comments:<BR> 79 yo M  Fall

## 2013-09-12 ENCOUNTER — Ambulatory Visit: Payer: Self-pay | Admitting: Podiatry

## 2013-09-26 ENCOUNTER — Encounter (HOSPITAL_COMMUNITY): Payer: Self-pay | Admitting: Emergency Medicine

## 2013-09-26 ENCOUNTER — Emergency Department (HOSPITAL_COMMUNITY): Payer: Medicare Other

## 2013-09-26 ENCOUNTER — Emergency Department (HOSPITAL_COMMUNITY)
Admission: EM | Admit: 2013-09-26 | Discharge: 2013-09-26 | Disposition: A | Discharge status: Home / Self Care | Payer: Medicare Other | Attending: Emergency Medicine | Admitting: Emergency Medicine

## 2013-09-26 ENCOUNTER — Ambulatory Visit: Payer: Self-pay | Admitting: Podiatry

## 2013-09-26 DIAGNOSIS — I10 Essential (primary) hypertension: Secondary | ICD-10-CM | POA: Insufficient documentation

## 2013-09-26 DIAGNOSIS — Z79899 Other long term (current) drug therapy: Secondary | ICD-10-CM | POA: Insufficient documentation

## 2013-09-26 DIAGNOSIS — Z7982 Long term (current) use of aspirin: Secondary | ICD-10-CM | POA: Insufficient documentation

## 2013-09-26 DIAGNOSIS — Z8673 Personal history of transient ischemic attack (TIA), and cerebral infarction without residual deficits: Secondary | ICD-10-CM | POA: Insufficient documentation

## 2013-09-26 DIAGNOSIS — R4182 Altered mental status, unspecified: Secondary | ICD-10-CM | POA: Insufficient documentation

## 2013-09-26 DIAGNOSIS — E119 Type 2 diabetes mellitus without complications: Secondary | ICD-10-CM | POA: Insufficient documentation

## 2013-09-26 DIAGNOSIS — N390 Urinary tract infection, site not specified: Secondary | ICD-10-CM | POA: Insufficient documentation

## 2013-09-26 DIAGNOSIS — R4789 Other speech disturbances: Secondary | ICD-10-CM | POA: Insufficient documentation

## 2013-09-26 LAB — CBC WITH DIFFERENTIAL/PLATELET
Basophils Absolute: 0 10*3/uL (ref 0.0–0.1)
HCT: 33.9 % — ABNORMAL LOW (ref 39.0–52.0)
Hemoglobin: 11.5 g/dL — ABNORMAL LOW (ref 13.0–17.0)
Lymphocytes Relative: 30 % (ref 12–46)
Lymphs Abs: 2.1 10*3/uL (ref 0.7–4.0)
MCV: 86.7 fL (ref 78.0–100.0)
Monocytes Absolute: 0.7 10*3/uL (ref 0.1–1.0)
Neutro Abs: 4.1 10*3/uL (ref 1.7–7.7)
RBC: 3.91 MIL/uL — ABNORMAL LOW (ref 4.22–5.81)
RDW: 14.4 % (ref 11.5–15.5)
WBC: 7.1 10*3/uL (ref 4.0–10.5)

## 2013-09-26 LAB — COMPREHENSIVE METABOLIC PANEL
ALT: 13 U/L (ref 0–53)
AST: 25 U/L (ref 0–37)
CO2: 25 mEq/L (ref 19–32)
Chloride: 102 mEq/L (ref 96–112)
Creatinine, Ser: 1.55 mg/dL — ABNORMAL HIGH (ref 0.50–1.35)
GFR calc Af Amer: 43 mL/min — ABNORMAL LOW (ref >90)
GFR calc non Af Amer: 37 mL/min — ABNORMAL LOW (ref >90)
Glucose, Bld: 166 mg/dL — ABNORMAL HIGH (ref 70–99)
Total Bilirubin: 0.3 mg/dL (ref 0.3–1.2)

## 2013-09-26 LAB — URINE MICROSCOPIC-ADD ON

## 2013-09-26 LAB — URINALYSIS, ROUTINE W REFLEX MICROSCOPIC
Bilirubin Urine: NEGATIVE
Hgb urine dipstick: NEGATIVE
Protein, ur: NEGATIVE mg/dL
Urobilinogen, UA: 0.2 mg/dL (ref 0.0–1.0)

## 2013-09-26 LAB — POCT I-STAT TROPONIN I: Troponin i, poc: 0 ng/mL (ref 0.00–0.08)

## 2013-09-26 MED ORDER — DEXTROSE 5 % IV SOLN
1.0000 g | Freq: Once | INTRAVENOUS | Status: AC
  Administered 2013-09-26: 1 g via INTRAVENOUS
  Filled 2013-09-26: qty 10

## 2013-09-26 MED ORDER — CEPHALEXIN 500 MG PO CAPS: 500.0000 mg | ORAL_CAPSULE | Freq: Two times a day (BID) | ORAL | Status: DC

## 2013-09-26 NOTE — ED Notes (Signed)
Did in and out cath on patient yellow urine in return 

## 2013-09-26 NOTE — ED Provider Notes (Signed)
CSN: [id number]     Arrival date & time 09/26/13  1458 History   First MD Initiated Contact with Patient 09/26/13 1504     Chief Complaint  Patient presents with  . Altered Mental Status   (Consider location/radiation/quality/duration/timing/severity/associated sxs/prior Treatment) HPI  This is a 79 year old who presents with altered mental status. He has a history of diabetes, CVA and hypertension. Patient called his son this afternoon at 1:30 PM and the son noted slurred speech. He went to the living facility to evaluate his back. His dad was reportedly back to baseline. They were to go to a doctor's appointment and his abdomen to the bathroom. At that time the patient appeared to get unresponsive. The son called staff he had a sternal rub the patient began to respond. Upon EMS arrival he was conscious and alert. Patient has a history of CVA with left-sided deficit. No focal deficit was noted at this time. Patient currently is taking aspirin. Per the patient's son he is back to baseline. Patient has no complaints and denies any chest pain, shortness breath, abdominal pain.  Past Medical History  Diagnosis Date  . Hyperlipemia   . Diabetes mellitus   . H/O: CVA (cardiovascular accident)     affected left side  . HTN (hypertension)    Past Surgical History  Procedure Laterality Date  . Transurethral resection of prostate    . Cataract extraction w/ intraocular lens implant     History reviewed. No pertinent family history. History  Substance Use Topics  . Smoking status: Never Smoker   . Smokeless tobacco: Not on file  . Alcohol Use: No    Review of Systems  Constitutional: Negative for fever.  Respiratory: Negative for cough, chest tightness and shortness of breath.   Cardiovascular: Negative for chest pain.  Gastrointestinal: Negative for nausea, vomiting and abdominal distention.  Neurological: Negative for dizziness and weakness.  Psychiatric/Behavioral: Negative for  confusion.  All other systems reviewed and are negative.    Allergies  Review of patient's allergies indicates no known allergies.  Home Medications   Current Outpatient Rx  Name  Route  Sig  Dispense  Refill  . acetaminophen (MAPAP) 500 MG tablet   Oral   Take 1,000 mg by mouth every 6 (six) hours as needed. For pain         . amLODipine (NORVASC) 5 MG tablet   Oral   Take 5 mg by mouth daily.         Marland Kitchen aspirin 81 MG chewable tablet   Oral   Chew 81 mg by mouth daily.         Marland Kitchen atorvastatin (LIPITOR) 10 MG tablet   Oral   Take 10 mg by mouth daily.           . bisacodyl (DULCOLAX) 5 MG EC tablet   Oral   Take 5 mg by mouth every other day.         . fenofibrate micronized (LOFIBRA) 134 MG capsule   Oral   Take 134 mg by mouth daily before breakfast.           . hydrochlorothiazide (MICROZIDE) 12.5 MG capsule   Oral   Take 12.5 mg by mouth daily.           . Multiple Vitamins-Iron (MULTIVITAMINS WITH IRON) TABS tablet   Oral   Take 1 tablet by mouth daily.         Marland Kitchen oxyCODONE (OXY IR/ROXICODONE) 5 MG immediate release  tablet   Oral   Take 5 mg by mouth every 12 (twelve) hours as needed. For pain         . pioglitazone (ACTOS) 15 MG tablet   Oral   Take 7.5 mg by mouth daily.          . polyethylene glycol (MIRALAX / GLYCOLAX) packet   Oral   Take 17 g by mouth daily.         Marland Kitchen selenium sulfide (SELSUN) 2.5 % shampoo   Topical   Apply 1 application topically at bedtime. Apply to face and scalp at bedtime, then wash off         . senna (SENOKOT) 8.6 MG TABS   Oral   Take 1 tablet by mouth 2 (two) times daily.         . Skin Protectants, Misc. (EUCERIN) cream   Topical   Apply 1 application topically 2 (two) times a week. Monday and Friday (to arms, legs, back and chest         . zolpidem (AMBIEN) 5 MG tablet   Oral   Take 5 mg by mouth at bedtime. scheduled         . cephALEXin (KEFLEX) 500 MG capsule   Oral   Take  1 capsule (500 mg total) by mouth 2 (two) times daily.   14 capsule   0   . EXPIRED: zolpidem (AMBIEN) 5 MG tablet   Oral   Take 1 tablet (5 mg total) by mouth at bedtime as needed for sleep (insomnia).   30 tablet   0    BP 120/49  Pulse 54  Temp(Src) 97.9 F (36.6 C) (Oral)  Resp 14  Ht 5\' 6"  (1.676 m)  Wt 180 lb (81.647 kg)  BMI 29.07 kg/m2  SpO2 95% Physical Exam  Nursing note and vitals reviewed. Constitutional: He is oriented to person, place, and time. No distress.  Elderly  HENT:  Head: Normocephalic and atraumatic.  Eyes: Pupils are equal, round, and reactive to light.  Neck: Neck supple.  Cardiovascular: Normal rate, regular rhythm and normal heart sounds.   No murmur heard. Pulmonary/Chest: Effort normal and breath sounds normal. No respiratory distress. He has no wheezes.  Abdominal: Soft. Bowel sounds are normal. There is no tenderness.  Musculoskeletal: He exhibits no edema.  Lymphadenopathy:    He has no cervical adenopathy.  Neurological: He is alert and oriented to person, place, and time.  Cranial nerves II through XII intact, no dysmetria to finger-nose-finger, equal strength in the bilateral upper and lower extremity, mild drift noted on the left which is reportedly at the patient's baseline, fluent speech  Skin: Skin is warm and dry.  Psychiatric: He has a normal mood and affect.    ED Course  Procedures (including critical care time) Labs Review Labs Reviewed  CBC WITH DIFFERENTIAL - Abnormal; Notable for the following:    RBC 3.91 (*)    Hemoglobin 11.5 (*)    HCT 33.9 (*)    All other components within normal limits  COMPREHENSIVE METABOLIC PANEL - Abnormal; Notable for the following:    Glucose, Bld 166 (*)    BUN 44 (*)    Creatinine, Ser 1.55 (*)    GFR calc non Af Amer 37 (*)    GFR calc Af Amer 43 (*)    All other components within normal limits  URINALYSIS, ROUTINE W REFLEX MICROSCOPIC - Abnormal; Notable for the following:     APPearance CLOUDY (*)  Leukocytes, UA TRACE (*)    All other components within normal limits  URINE MICROSCOPIC-ADD ON - Abnormal; Notable for the following:    Bacteria, UA FEW (*)    Casts HYALINE CASTS (*)    All other components within normal limits  URINE CULTURE  POCT I-STAT TROPONIN I   Imaging Review Ct Head Wo Contrast  09/26/2013   CLINICAL DATA:  Altered mental status.  EXAM: CT HEAD WITHOUT CONTRAST  TECHNIQUE: Contiguous axial images were obtained from the base of the skull through the vertex without intravenous contrast.  COMPARISON:  01/21/2013.  FINDINGS: No mass lesion, mass effect, midline shift, hydrocephalus, hemorrhage. No acute territorial cortical ischemia/infarct. Atrophy and chronic ischemic white matter disease is present. Chronic mucoperiosteal thickening and high density inspissated secretions in the frontal and maxillary sinuses suggests allergic fungal rind of sinusitis. Intracranial atherosclerosis is present. Benign basal ganglia calcifications.  IMPRESSION: Atrophy and chronic ischemic white matter disease without acute intracranial abnormality. Unchanged chronic paranasal sinus disease.   Electronically Signed   By: Andreas Newport M.D.   On: 09/26/2013 17:00    EKG Interpretation     Ventricular Rate:  67 PR Interval:  212 QRS Duration: 99 QT Interval:  431 QTC Calculation: 455 R Axis:   24 Text Interpretation:  Sinus rhythm Prolonged PR interval            MDM   1. Urinary tract infection   2. Altered mental status    This is a 79 year old male who presents with altered mental status and slurred speech. Per the son, the patient is mostly back to his baseline. He has no focal neurologic deficit. He has a history of stroke and is currently on aspirin. Workup is notable for white cells and bacteria on a cath urine.  CT scan is negative for acute process. Patient's creatinine is at his baseline. I suspect patient's altered mental status earlier  today is likely secondary to her urinary tract infection. He was given Rocephin. He continues to be his baseline. I discussed with the patient and his son that live feel his symptoms are most likely secondary to urinary tract infection, I cannot fully rule out TIA. I have low suspicion for TIA as this would generally not make you unresponsive.  The son and the patient stated understanding and declined further workup at this time. They wish to just be treated for a urinary tract infection and monitor patient's symptoms.  After history, exam, and medical workup I feel the patient has been appropriately medically screened and is safe for discharge home. Pertinent diagnoses were discussed with the patient. Patient was given return precautions.     Shon Baton, MD 09/26/13 2125

## 2013-09-26 NOTE — ED Notes (Signed)
Pt presents with altered mental status and "unresponsiveness" Pt last seen normal by staff at  12:30. Spoke to son at 13:30 and had "slurred speech" pt was found unresponsive by staff in bathroom at 13:30 "unresponsive" responding only to sternal rub. Pt was sitting up conscious and alert at the time EMS arrival.

## 2013-09-27 LAB — URINE CULTURE: Colony Count: NO GROWTH

## 2014-06-23 ENCOUNTER — Emergency Department (HOSPITAL_COMMUNITY)
Admission: EM | Admit: 2014-06-23 | Discharge: 2014-06-23 | Disposition: A | Discharge status: Home / Self Care | Payer: PRIVATE HEALTH INSURANCE | Attending: Emergency Medicine | Admitting: Emergency Medicine

## 2014-06-23 ENCOUNTER — Encounter (HOSPITAL_COMMUNITY): Payer: Self-pay | Admitting: Emergency Medicine

## 2014-06-23 ENCOUNTER — Emergency Department (HOSPITAL_COMMUNITY): Payer: PRIVATE HEALTH INSURANCE

## 2014-06-23 DIAGNOSIS — S61209A Unspecified open wound of unspecified finger without damage to nail, initial encounter: Secondary | ICD-10-CM | POA: Diagnosis not present

## 2014-06-23 DIAGNOSIS — Z8673 Personal history of transient ischemic attack (TIA), and cerebral infarction without residual deficits: Secondary | ICD-10-CM | POA: Diagnosis not present

## 2014-06-23 DIAGNOSIS — S3992XA Unspecified injury of lower back, initial encounter: Secondary | ICD-10-CM

## 2014-06-23 DIAGNOSIS — Z7982 Long term (current) use of aspirin: Secondary | ICD-10-CM | POA: Diagnosis not present

## 2014-06-23 DIAGNOSIS — S61409A Unspecified open wound of unspecified hand, initial encounter: Secondary | ICD-10-CM | POA: Diagnosis not present

## 2014-06-23 DIAGNOSIS — IMO0002 Reserved for concepts with insufficient information to code with codable children: Secondary | ICD-10-CM | POA: Insufficient documentation

## 2014-06-23 DIAGNOSIS — E785 Hyperlipidemia, unspecified: Secondary | ICD-10-CM | POA: Insufficient documentation

## 2014-06-23 DIAGNOSIS — I10 Essential (primary) hypertension: Secondary | ICD-10-CM | POA: Diagnosis not present

## 2014-06-23 DIAGNOSIS — Z79899 Other long term (current) drug therapy: Secondary | ICD-10-CM | POA: Insufficient documentation

## 2014-06-23 DIAGNOSIS — W19XXXA Unspecified fall, initial encounter: Secondary | ICD-10-CM

## 2014-06-23 DIAGNOSIS — Y9289 Other specified places as the place of occurrence of the external cause: Secondary | ICD-10-CM | POA: Insufficient documentation

## 2014-06-23 DIAGNOSIS — F411 Generalized anxiety disorder: Secondary | ICD-10-CM | POA: Diagnosis not present

## 2014-06-23 DIAGNOSIS — Y9301 Activity, walking, marching and hiking: Secondary | ICD-10-CM | POA: Insufficient documentation

## 2014-06-23 DIAGNOSIS — W1809XA Striking against other object with subsequent fall, initial encounter: Secondary | ICD-10-CM | POA: Insufficient documentation

## 2014-06-23 DIAGNOSIS — S61219A Laceration without foreign body of unspecified finger without damage to nail, initial encounter: Secondary | ICD-10-CM

## 2014-06-23 DIAGNOSIS — S61412A Laceration without foreign body of left hand, initial encounter: Secondary | ICD-10-CM

## 2014-06-23 DIAGNOSIS — E119 Type 2 diabetes mellitus without complications: Secondary | ICD-10-CM | POA: Diagnosis not present

## 2014-06-23 MED ORDER — LIDOCAINE HCL 1 % IJ SOLN
INTRAMUSCULAR | Status: AC
  Filled 2014-06-23: qty 20

## 2014-06-23 MED ORDER — CEPHALEXIN 500 MG PO CAPS: 500.0000 mg | ORAL_CAPSULE | Freq: Two times a day (BID) | ORAL | Status: AC

## 2014-06-23 NOTE — ED Provider Notes (Signed)
80 year old male with a mechanical fall when he lost his balance. He landed on his lower back and buttocks, caught his left hand on something as he fell causing a laceration between the fourth and fifth digits in the web space. On exam the patient appears in no distress, has no signs of head injury, normal range of motion of all 4 extremities including the fingers of the hand. He does have a short laceration extending between the fourth and fifth digits of the left hand with underlying structures seen including subcutaneous tissue. This is more than just a senile purpura or skin tear. X-ray showed no signs of fractures or foreign bodies, primary repair by physician assistant, discharged home with home health that he hurt he has come in to check on him for wound checks and sutures out in 7-10 days based on healing pattern. Discussed with family member at the bedside who is in agreement with the plan.  Medical screening examination/treatment/procedure(s) were conducted as a shared visit with non-physician practitioner(s) and myself.  I personally evaluated the patient during the encounter.  Clinical Impression:   #1 - fall #2 - contusion of buttock #3 - laceration of left hand      Vida RollerBrian D Shriley Joffe, MD 06/24/14 0020

## 2014-06-23 NOTE — ED Provider Notes (Signed)
CSN: [id number]     Arrival date & time 06/23/14  1458 History   First MD Initiated Contact with Patient 06/23/14 1733     Chief Complaint  Patient presents with  . Fall  . Extremity Laceration  . Back Pain    HPI The majority of the history was provided by a family member. Patient is 80 yo who lives in a [facility] who fell while trying to get ice cream at 1pm today. Patient reports that he lost his balance. He had no loss of consciousness and did not hit his head. He hit his back and endorses pain here. And his left hand was caught on the door handle. He uses a walker. He has a history of stroke with left sided weakness in upper and lower extremities. He denies saddle anesthesia or loss of bladder or bowel incontinence.    Past Medical History  Diagnosis Date  . Hyperlipemia   . Diabetes mellitus   . H/O: CVA (cardiovascular accident)     affected left side  . HTN (hypertension)    Past Surgical History  Procedure Laterality Date  . Transurethral resection of prostate    . Cataract extraction w/ intraocular lens implant     No family history on file. History  Substance Use Topics  . Smoking status: Never Smoker   . Smokeless tobacco: Not on file  . Alcohol Use: No    Review of Systems  Constitutional: Negative for fever and chills.  HENT: Negative for congestion and rhinorrhea.   Respiratory: Negative for cough and shortness of breath.   Cardiovascular: Negative for chest pain and palpitations.  Gastrointestinal: Negative for nausea, vomiting and diarrhea.  Genitourinary: Negative for dysuria and hematuria.  Musculoskeletal: Positive for back pain. Negative for gait problem.       Walks with a walker  Skin: Negative for color change.  Neurological: Negative for dizziness, weakness and headaches.  Psychiatric/Behavioral: Negative for hallucinations and behavioral problems. The patient is nervous/anxious.       Allergies  Review of patient's allergies  indicates no known allergies.  Home Medications   Prior to Admission medications   Medication Sig Start Date End Date Taking? Authorizing Provider  acetaminophen (MAPAP) 500 MG tablet Take 1,000 mg by mouth every 6 (six) hours as needed. For pain   Yes Historical Provider, MD  aspirin 81 MG chewable tablet Chew 81 mg by mouth daily.   Yes Historical Provider, MD  atorvastatin (LIPITOR) 10 MG tablet Take 10 mg by mouth daily.     Yes Historical Provider, MD  bisacodyl (DULCOLAX) 5 MG EC tablet Take 5 mg by mouth every other day.   Yes Historical Provider, MD  clobetasol cream (TEMOVATE) 0.05 % Apply 1 application topically 2 (two) times daily. To scalp lesion.   Yes Historical Provider, MD  hydrochlorothiazide (MICROZIDE) 12.5 MG capsule Take 12.5 mg by mouth daily.     Yes Historical Provider, MD  lisinopril (PRINIVIL,ZESTRIL) 5 MG tablet Take 7.5 mg by mouth daily.   Yes Historical Provider, MD  Multiple Vitamins-Iron (MULTIVITAMINS WITH IRON) TABS tablet Take 1 tablet by mouth daily.   Yes Historical Provider, MD  senna (SENOKOT) 8.6 MG TABS Take 1 tablet by mouth daily.    Yes Historical Provider, MD  zolpidem (AMBIEN) 5 MG tablet Take 2.5 mg by mouth at bedtime. scheduled   Yes Historical Provider, MD  zolpidem (AMBIEN) 5 MG tablet Take 1 tablet (5 mg total) by mouth at bedtime as  needed for sleep (insomnia). 11/03/11 11/02/12  Dorothea Ogle, MD   BP 125/72  Pulse 98  Temp(Src) 98.2 F (36.8 C) (Oral)  Resp 20  SpO2 94% Physical Exam  Vitals reviewed. Constitutional: He appears well-nourished. No distress.  HENT:  Head: Normocephalic and atraumatic.  Eyes: Conjunctivae and EOM are normal.  Neck: Neck supple.  Cardiovascular: Normal rate and regular rhythm.   No murmur heard. Pulmonary/Chest: Effort normal and breath sounds normal. No respiratory distress. He has no wheezes.  Abdominal: Soft. He exhibits no distension. There is no tenderness. There is no guarding.   Musculoskeletal: Normal range of motion.       Left wrist: He exhibits tenderness and laceration. He exhibits no swelling.       Lumbar back: He exhibits tenderness and pain. He exhibits normal range of motion, no bony tenderness, no swelling, no edema and no laceration.  Left hand 2 cm laceration between 4th and 5th finger. Without foreign bodies or signs of infection. Neurovascularly intact. Weakness on left side (pt family reports it is his baseline). No signs of infection.  Neurological: He is alert. He exhibits normal muscle tone.  Skin: Skin is warm and dry. He is not diaphoretic.  Psychiatric: He has a normal mood and affect. His behavior is normal. Thought content normal.    ED Course  LACERATION REPAIR Date/Time: 06/23/2014 8:20 PM Performed by: Gianny Sabino L Authorized by: Ansar Skoda L Consent: Verbal consent obtained. Risks and benefits: risks, benefits and alternatives were discussed Consent given by: patient and power of attorney Patient understanding: patient states understanding of the procedure being performed Patient consent: the patient's understanding of the procedure matches consent given Procedure consent: procedure consent matches procedure scheduled Imaging studies: imaging studies available Patient identity confirmed: verbally with patient Location: left hand. Between 4th and 5th fingers. Laceration length: 2 cm Contaminated: non contaminated. Foreign bodies: no foreign bodies Tendon involvement: none Nerve involvement: none Vascular damage: no Anesthesia: local infiltration Local anesthetic: lidocaine 1% without epinephrine Anesthetic total: 5 ml Patient sedated: no Irrigation solution: saline Irrigation method: syringe Amount of cleaning: standard Debridement: none Degree of undermining: none Skin closure: 5-0 Prolene Number of sutures: 4 Technique: simple Approximation difficulty: simple Dressing: antibiotic ointment, splint, gauze roll  and 4x4 sterile gauze Patient tolerance: Patient tolerated the procedure well with no immediate complications.   (including critical care time) Labs Review Labs Reviewed - No data to display  Imaging Review Dg Lumbar Spine Complete  06/23/2014   CLINICAL DATA:  Fall, low back pain  EXAM: LUMBAR SPINE - COMPLETE 4+ VIEW  COMPARISON:  11/09/2012  FINDINGS: 5 non rib-bearing lumbar type vertebral bodies are identified. Multilevel disc degenerative change noted. Again noted are compression deformities of L1, superior endplate L2, L4, and superior endplate L5. No significant change since prior exam. No bony retropulsion. Atheromatous aortic calcification is identified without calcified aneurysm.  IMPRESSION: Stable multilevel lumbar compression fractures. Concomitant disc degenerative change reidentified.   Electronically Signed   By: Christiana Pellant M.D.   On: 06/23/2014 16:01   Dg Hand Complete Left  06/23/2014   CLINICAL DATA:  Traumatic injury and pain  EXAM: LEFT HAND - COMPLETE 3+ VIEW  COMPARISON:  12/18/2010  FINDINGS: Significant degenerative changes are seen throughout the interphalangeal joints. Heavy vascular calcifications are noted. No acute fracture or dislocation is noted. The degree of degenerative change has progressed in the interval from the prior exam.  IMPRESSION: Significant degenerative changes without acute bony abnormality.  Electronically Signed   By: Alcide Clever M.D.   On: 06/23/2014 16:00     EKG Interpretation None      MDM   Final diagnoses:  Fall, initial encounter  Laceration of left hand without complication, including fingers, initial encounter   Patient with recent mechanical fall with laceration to the left hand and back injury. Xrays show no acute fracture in back or hand. Neurovascularly intact. Laceration was repaired in ED today and splint of 5th finger applied. Patient prescribed keflex for 5 days for infection prophylaxis. Prescription was called in  and patient was called. Follow up in 7-10 days with PCP/urgent care for suture removal. Patient to return back to [facility].  Discussed return precautions with patient. Discussed all results and patient verbalizes understanding and agrees with plan.       Louann Sjogren, PA-C 06/23/14 2358

## 2014-06-23 NOTE — Progress Notes (Signed)
Orthopedic Tech Progress Note Patient Details:  Dean MoteGeorge Corbello 23-Jul-1919 [id number] Applied aluminum/foam malleable finger splints to Lt 4th and 5th fingers.  Capillary refill less than 2 seconds before and after splinting. Ortho Devices Type of Ortho Device: Finger splint Ortho Device/Splint Location: LUE Ortho Device/Splint Interventions: Application   Lesle ChrisGilliland, Kenzli Barritt L 06/23/2014, 8:44 PM

## 2014-06-23 NOTE — ED Notes (Signed)
Pt from [facility], was walking with walker and fell backwards. Pt c/o lower back pain and has lac to left hand b/w pinky and ring finger.

## 2014-06-23 NOTE — Discharge Instructions (Signed)
Return to the emergency room with worsening of symptoms or with symptoms that are concerning. Follow up with PCP, ED or urgent care in 10 days for suture removal.  Monitor for signs of infection such as redness, pain, or discharge. Take all antibiotic pills. Take the whole course to prevent resistance to bacteria. Follow below instructions for Laceration care.  Fall Prevention and Home Safety Falls cause injuries and can affect all age groups. It is possible to prevent falls.  HOW TO PREVENT FALLS  Wear shoes with rubber soles that do not have an opening for your toes.  Keep the inside and outside of your house well lit.  Use night lights throughout your home.  Remove clutter from floors.  Clean up floor spills.  Remove throw rugs or fasten them to the floor with carpet tape.  Do not place electrical cords across pathways.  Put grab bars by your tub, shower, and toilet. Do not use towel bars as grab bars.  Put handrails on both sides of the stairway. Fix loose handrails.  Do not climb on stools or stepladders, if possible.  Do not wax your floors.  Repair uneven or unsafe sidewalks, walkways, or stairs.  Keep items you use a lot within reach.  Be aware of pets.  Keep emergency numbers next to the telephone.  Put smoke detectors in your home and near bedrooms. Ask your doctor what other things you can do to prevent falls. Document Released: 09/03/2009 Document Revised: 05/08/2012 Document Reviewed: 02/07/2012 [facility] Patient Information 2015 [place], [place]. This information is not intended to replace advice given to you by your health care provider. Make sure you discuss any questions you have with your health care provider.

## 2014-06-24 NOTE — ED Provider Notes (Signed)
Medical screening examination/treatment/procedure(s) were conducted as a shared visit with non-physician practitioner(s) and myself.  I personally evaluated the patient during the encounter  Please see my separate respective documentation pertaining to this patient encounter   Vida RollerBrian D Eldine Rencher, MD 06/24/14 0020

## 2016-01-22 ENCOUNTER — Encounter (HOSPITAL_COMMUNITY): Payer: Self-pay | Admitting: Emergency Medicine

## 2016-01-22 ENCOUNTER — Emergency Department (HOSPITAL_COMMUNITY)
Admission: EM | Admit: 2016-01-22 | Discharge: 2016-01-23 | Disposition: A | Discharge status: Home / Self Care | Attending: Emergency Medicine | Admitting: Emergency Medicine

## 2016-01-22 ENCOUNTER — Emergency Department (HOSPITAL_COMMUNITY)

## 2016-01-22 DIAGNOSIS — Y92129 Unspecified place in nursing home as the place of occurrence of the external cause: Secondary | ICD-10-CM | POA: Diagnosis not present

## 2016-01-22 DIAGNOSIS — S61401A Unspecified open wound of right hand, initial encounter: Secondary | ICD-10-CM | POA: Insufficient documentation

## 2016-01-22 DIAGNOSIS — Z8673 Personal history of transient ischemic attack (TIA), and cerebral infarction without residual deficits: Secondary | ICD-10-CM | POA: Insufficient documentation

## 2016-01-22 DIAGNOSIS — Y998 Other external cause status: Secondary | ICD-10-CM | POA: Insufficient documentation

## 2016-01-22 DIAGNOSIS — W19XXXA Unspecified fall, initial encounter: Secondary | ICD-10-CM

## 2016-01-22 DIAGNOSIS — S0081XA Abrasion of other part of head, initial encounter: Secondary | ICD-10-CM | POA: Diagnosis not present

## 2016-01-22 DIAGNOSIS — E785 Hyperlipidemia, unspecified: Secondary | ICD-10-CM | POA: Diagnosis not present

## 2016-01-22 DIAGNOSIS — Z79899 Other long term (current) drug therapy: Secondary | ICD-10-CM | POA: Diagnosis not present

## 2016-01-22 DIAGNOSIS — F0390 Unspecified dementia without behavioral disturbance: Secondary | ICD-10-CM | POA: Insufficient documentation

## 2016-01-22 DIAGNOSIS — W050XXA Fall from non-moving wheelchair, initial encounter: Secondary | ICD-10-CM | POA: Insufficient documentation

## 2016-01-22 DIAGNOSIS — E119 Type 2 diabetes mellitus without complications: Secondary | ICD-10-CM | POA: Diagnosis not present

## 2016-01-22 DIAGNOSIS — I10 Essential (primary) hypertension: Secondary | ICD-10-CM | POA: Diagnosis not present

## 2016-01-22 DIAGNOSIS — S61411A Laceration without foreign body of right hand, initial encounter: Secondary | ICD-10-CM

## 2016-01-22 DIAGNOSIS — Z7952 Long term (current) use of systemic steroids: Secondary | ICD-10-CM | POA: Diagnosis not present

## 2016-01-22 DIAGNOSIS — S0001XA Abrasion of scalp, initial encounter: Secondary | ICD-10-CM

## 2016-01-22 DIAGNOSIS — Z792 Long term (current) use of antibiotics: Secondary | ICD-10-CM | POA: Diagnosis not present

## 2016-01-22 DIAGNOSIS — S6991XA Unspecified injury of right wrist, hand and finger(s), initial encounter: Secondary | ICD-10-CM | POA: Diagnosis present

## 2016-01-22 DIAGNOSIS — Y9389 Activity, other specified: Secondary | ICD-10-CM | POA: Diagnosis not present

## 2016-01-22 DIAGNOSIS — Z7982 Long term (current) use of aspirin: Secondary | ICD-10-CM | POA: Diagnosis not present

## 2016-01-22 MED ORDER — BACITRACIN ZINC 500 UNIT/GM EX OINT
1.0000 "application " | TOPICAL_OINTMENT | Freq: Two times a day (BID) | CUTANEOUS | Status: DC
  Administered 2016-01-22: 1 via TOPICAL

## 2016-01-22 NOTE — ED Notes (Signed)
Bed: [facility] Expected date:  Expected time:  Means of arrival:  Comments: 

## 2016-01-22 NOTE — ED Notes (Signed)
Patient waiting on PTAR. 

## 2016-01-22 NOTE — ED Notes (Signed)
Per EMS-witnessed fall from [facility]-fell forward while attempting to fall asleep, hit head-no LOC, no thinners-skin tear to forehead and to right hand between 2 nd and 3 rd digits-superficial, no bleeding

## 2016-01-22 NOTE — Discharge Instructions (Signed)
Head Injury, Adult You have a head injury. Headaches and throwing up (vomiting) are common after a head injury. It should be easy to wake up from sleeping. Sometimes you must stay in the hospital. Most problems happen within the first 24 hours. Side effects may occur up to 7-10 days after the injury.  WHAT ARE THE TYPES OF HEAD INJURIES? Head injuries can be as minor as a bump. Some head injuries can be more severe. More severe head injuries include:  A jarring injury to the brain (concussion).  A bruise of the brain (contusion). This mean there is bleeding in the brain that can cause swelling.  A cracked skull (skull fracture).  Bleeding in the brain that collects, clots, and forms a bump (hematoma). WHEN SHOULD I GET HELP RIGHT AWAY?   You are confused or sleepy.  You cannot be woken up.  You feel sick to your stomach (nauseous) or keep throwing up (vomiting).  Your dizziness or unsteadiness is getting worse.  You have very bad, lasting headaches that are not helped by medicine. Take medicines only as told by your doctor.  You cannot use your arms or legs like normal.  You cannot walk.  You notice changes in the black spots in the center of the colored part of your eye (pupil).  You have clear or bloody fluid coming from your nose or ears.  You have trouble seeing. During the next 24 hours after the injury, you must stay with someone who can watch you. This person should get help right away (call 911 in the [place] if you start to shake and are not able to control it (have seizures), you pass out, or you are unable to wake up. HOW CAN I PREVENT A HEAD INJURY IN THE FUTURE?  Wear seat belts.  Wear a helmet while bike riding and playing sports like football.  Stay away from dangerous activities around the house. WHEN CAN I RETURN TO NORMAL ACTIVITIES AND ATHLETICS? See your doctor before doing these activities. You should not do normal activities or play contact sports until 1  week after the following symptoms have stopped:  Headache that does not go away.  Dizziness.  Poor attention.  Confusion.  Memory problems.  Sickness to your stomach or throwing up.  Tiredness.  Fussiness.  Bothered by bright lights or loud noises.  Anxiousness or depression.  Restless sleep. MAKE SURE YOU:   Understand these instructions.  Will watch your condition.  Will get help right away if you are not doing well or get worse.   This information is not intended to replace advice given to you by your health care provider. Make sure you discuss any questions you have with your health care provider.   Document Released: 10/20/2008 Document Revised: 11/28/2014 Document Reviewed: 07/15/2013 Elsevier Interactive Patient Education 2016 Else[employer] Inc[employer]  Abrasion An abrasion is a cut or scrape on the outer surface of your skin. An abrasion does not extend through all of the layers of your skin. It is important to care for your abrasion properly to prevent infection. CAUSES Most abrasions are caused by falling on or gliding across the ground or another surface. When your skin rubs on something, the outer and inner layer of skin rubs off.  SYMPTOMS A cut or scrape is the main symptom of this condition. The scrape may be bleeding, or it may appear red or pink. If there was an associated fall, there may be an underlying bruise. DIAGNOSIS An abrasion is  diagnosed with a physical exam. TREATMENT Treatment for this condition depends on how large and deep the abrasion is. Usually, your abrasion will be cleaned with water and mild soap. This removes any dirt or debris that may be stuck. An antibiotic ointment may be applied to the abrasion to help prevent infection. A bandage (dressing) may be placed on the abrasion to keep it clean. You may also need a tetanus shot. HOME CARE INSTRUCTIONS Medicines  Take or apply medicines only as directed by your health care provider.  If you  were prescribed an antibiotic ointment, finish all of it even if you start to feel better. Wound Care  Clean the wound with mild soap and water 2-3 times per day or as directed by your health care provider. Pat your wound dry with a clean towel. Do not rub it.  There are many different ways to close and cover a wound. Follow instructions from your health care provider about:  Wound care.  Dressing changes and removal.  Check your wound every day for signs of infection. Watch for:  Redness, swelling, or pain.  Fluid, blood, or pus. General Instructions  Keep the dressing dry as directed by your health care provider. Do not take baths, swim, use a hot tub, or do anything that would put your wound underwater until your health care provider approves.  If there is swelling, raise (elevate) the injured area above the level of your heart while you are sitting or lying down.  Keep all follow-up visits as directed by your health care provider. This is important. SEEK MEDICAL CARE IF:  You received a tetanus shot and you have swelling, severe pain, redness, or bleeding at the injection site.  Your pain is not controlled with medicine.  You have increased redness, swelling, or pain at the site of your wound. SEEK IMMEDIATE MEDICAL CARE IF:  You have a red streak going away from your wound.  You have a fever.  You have fluid, blood, or pus coming from your wound.  You notice a bad smell coming from your wound or your dressing.   This information is not intended to replace advice given to you by your health care provider. Make sure you discuss any questions you have with your health care provider.   Document Released: 08/17/2005 Document Revised: 07/29/2015 Document Reviewed: 11/05/2014 Elsevier Interactive Patient Education 2016 [employer]  Delayed Wound Closure Sometimes, your health care provider will decide to delay closing a wound for several days. This is done when the  wound is badly bruised, dirty, or when it has been several hours since the injury happened. By delaying the closure of your wound, the risk of infection is reduced. Wounds that are closed in 3-7 days after being cleaned up and dressed heal just as well as those that are closed right away. HOME CARE INSTRUCTIONS  Rest and elevate the injured area until the pain and swelling are gone.  Have your wound checked as instructed by your health care provider. SEEK MEDICAL CARE IF:  You develop unusual or increased swelling or redness around the wound.  You have increasing pain or tenderness.  There is increasing fluid (drainage) or a bad smelling drainage coming from the wound.   This information is not intended to replace advice given to you by your health care provider. Make sure you discuss any questions you have with your health care provider.   Document Released: 11/07/2005 Document Revised: 11/12/2013 Document Reviewed: 05/07/2013 Elsevier Interactive Patient  Education ©2016 Elsevier Inc. ° °

## 2016-01-22 NOTE — ED Provider Notes (Signed)
CSN: [id number]     Arrival date & time 01/22/16  6295 History   First MD Initiated Contact with Patient 01/22/16 1833     Chief Complaint  Patient presents with  . Fall     (Consider location/radiation/quality/duration/timing/severity/associated sxs/prior Treatment) HPI Comments: Patient presents to the emergency department with chief complaint of mechanical fall. Patient was drifting to sleep in his wheelchair, when he leaned forward and fell out of his wheelchair. He landed on his face, causing an abrasion to his left forehead as well as a small skin tear on his right hand. There is no loss of consciousness. Per the nursing home, the patient is at his normal baseline. He takes aspirin, but no other anticoagulants. History is limited secondary to dementia.  Level V caveat applies.  The history is provided by the patient. No language interpreter was used.    Past Medical History  Diagnosis Date  . Hyperlipemia   . Diabetes mellitus   . H/O: CVA (cardiovascular accident)     affected left side  . HTN (hypertension)    Past Surgical History  Procedure Laterality Date  . Transurethral resection of prostate    . Cataract extraction w/ intraocular lens implant     No family history on file. Social History  Substance Use Topics  . Smoking status: Never Smoker   . Smokeless tobacco: None  . Alcohol Use: No    Review of Systems  Unable to perform ROS: Dementia      Allergies  Review of patient's allergies indicates no known allergies.  Home Medications   Prior to Admission medications   Medication Sig Start Date End Date Taking? Authorizing Provider  acetaminophen (MAPAP) 500 MG tablet Take 1,000 mg by mouth every 6 (six) hours as needed. For pain    Historical Provider, MD  aspirin 81 MG chewable tablet Chew 81 mg by mouth daily.    Historical Provider, MD  atorvastatin (LIPITOR) 10 MG tablet Take 10 mg by mouth daily.      Historical Provider, MD  bisacodyl (DULCOLAX)  5 MG EC tablet Take 5 mg by mouth every other day.    Historical Provider, MD  cephALEXin (KEFLEX) 500 MG capsule Take 1 capsule (500 mg total) by mouth 2 (two) times daily. 06/23/14   Oswaldo Conroy, PA-C  clobetasol cream (TEMOVATE) 0.05 % Apply 1 application topically 2 (two) times daily. To scalp lesion.    Historical Provider, MD  hydrochlorothiazide (MICROZIDE) 12.5 MG capsule Take 12.5 mg by mouth daily.      Historical Provider, MD  lisinopril (PRINIVIL,ZESTRIL) 5 MG tablet Take 7.5 mg by mouth daily.    Historical Provider, MD  Multiple Vitamins-Iron (MULTIVITAMINS WITH IRON) TABS tablet Take 1 tablet by mouth daily.    Historical Provider, MD  senna (SENOKOT) 8.6 MG TABS Take 1 tablet by mouth daily.     Historical Provider, MD  zolpidem (AMBIEN) 5 MG tablet Take 1 tablet (5 mg total) by mouth at bedtime as needed for sleep (insomnia). 11/03/11 11/02/12  Dorothea Ogle, MD  zolpidem (AMBIEN) 5 MG tablet Take 2.5 mg by mouth at bedtime. scheduled    Historical Provider, MD   BP 130/58 mmHg  Pulse 89  Temp(Src) 97.7 F (36.5 C) (Oral)  Resp 16  SpO2 98% Physical Exam  Constitutional: He is oriented to person, place, and time. He appears well-developed and well-nourished.  HENT:  Head: Normocephalic and atraumatic.  Mild abrasion to left forehead, no laceration  Eyes:  Conjunctivae and EOM are normal. Pupils are equal, round, and reactive to light. Right eye exhibits no discharge. Left eye exhibits no discharge. No scleral icterus.  Neck: Normal range of motion. Neck supple. No JVD present.  Cardiovascular: Normal rate, regular rhythm and normal heart sounds.  Exam reveals no gallop and no friction rub.   No murmur heard. Pulmonary/Chest: Effort normal and breath sounds normal. No respiratory distress. He has no wheezes. He has no rales. He exhibits no tenderness.  Abdominal: Soft. He exhibits no distension and no mass. There is no tenderness. There is no rebound and no guarding.   Musculoskeletal: Normal range of motion. He exhibits no edema or tenderness.  Neurological: He is alert and oriented to person, place, and time.  Skin: Skin is warm and dry.  Skin tear between right second and third MCPs  Psychiatric: He has a normal mood and affect. His behavior is normal. Judgment and thought content normal.  Nursing note and vitals reviewed.   ED Course  Procedures (including critical care time) Results for orders placed or performed during the hospital encounter of 09/26/13  Urine culture  Result Value Ref Range   Specimen Description URINE, CATHETERIZED    Special Requests NONE    Culture  Setup Time      09/27/2013 03:03 Performed at [facility]   Colony Count NO GROWTH Performed at [facility]    Culture NO GROWTH Performed at [facility]    Report Status 09/27/2013 FINAL   CBC with Differential  Result Value Ref Range   WBC 7.1 4.0 - 10.5 K/uL   RBC 3.91 (L) 4.22 - 5.81 MIL/uL   Hemoglobin 11.5 (L) 13.0 - 17.0 g/dL   HCT 45.433.9 (L) 09.839.0 - 11.952.0 %   MCV 86.7 78.0 - 100.0 fL   MCH 29.4 26.0 - 34.0 pg   MCHC 33.9 30.0 - 36.0 g/dL   RDW 14.714.4 82.911.5 - 56.215.5 %   Platelets 219 150 - 400 K/uL   Neutrophils Relative % 58 43 - 77 %   Neutro Abs 4.1 1.7 - 7.7 K/uL   Lymphocytes Relative 30 12 - 46 %   Lymphs Abs 2.1 0.7 - 4.0 K/uL   Monocytes Relative 10 3 - 12 %   Monocytes Absolute 0.7 0.1 - 1.0 K/uL   Eosinophils Relative 3 0 - 5 %   Eosinophils Absolute 0.2 0.0 - 0.7 K/uL   Basophils Relative 0 0 - 1 %   Basophils Absolute 0.0 0.0 - 0.1 K/uL  Comprehensive metabolic panel  Result Value Ref Range   Sodium 138 135 - 145 mEq/L   Potassium 4.3 3.5 - 5.1 mEq/L   Chloride 102 96 - 112 mEq/L   CO2 25 19 - 32 mEq/L   Glucose, Bld 166 (H) 70 - 99 mg/dL   BUN 44 (H) 6 - 23 mg/dL   Creatinine, Ser 1.301.55 (H) 0.50 - 1.35 mg/dL   Calcium 8.5 8.4 - 86.510.5 mg/dL   Total Protein 6.8 6.0 - 8.3 g/dL   Albumin 3.5 3.5 - 5.2 g/dL   AST 25 0 -  37 U/L   ALT 13 0 - 53 U/L   Alkaline Phosphatase 109 39 - 117 U/L   Total Bilirubin 0.3 0.3 - 1.2 mg/dL   GFR calc non Af Amer 37 (L) >90 mL/min   GFR calc Af Amer 43 (L) >90 mL/min  Urinalysis, Routine w reflex microscopic  Result Value Ref Range   Color,  Urine YELLOW YELLOW   APPearance CLOUDY (A) CLEAR   Specific Gravity, Urine 1.012 1.005 - 1.030   pH 5.0 5.0 - 8.0   Glucose, UA NEGATIVE NEGATIVE mg/dL   Hgb urine dipstick NEGATIVE NEGATIVE   Bilirubin Urine NEGATIVE NEGATIVE   Ketones, ur NEGATIVE NEGATIVE mg/dL   Protein, ur NEGATIVE NEGATIVE mg/dL   Urobilinogen, UA 0.2 0.0 - 1.0 mg/dL   Nitrite NEGATIVE NEGATIVE   Leukocytes, UA TRACE (A) NEGATIVE  Urine microscopic-add on  Result Value Ref Range   WBC, UA 3-6 <3 WBC/hpf   Bacteria, UA FEW (A) RARE   Casts HYALINE CASTS (A) NEGATIVE   Urine-Other AMORPHOUS URATES/PHOSPHATES   POCT i-Stat troponin I  Result Value Ref Range   Troponin i, poc 0.00 0.00 - 0.08 ng/mL   Comment 3           Ct Head Wo Contrast  01/22/2016  CLINICAL DATA:  Pt from [facility], was walking with walker and fell backwards. Pt c/o lower back pain and has lac to left hand b/w pinky and ring finger. EXAM: CT HEAD WITHOUT CONTRAST CT CERVICAL SPINE WITHOUT CONTRAST TECHNIQUE: Multidetector CT imaging of the head and cervical spine was performed following the standard protocol without intravenous contrast. Multiplanar CT image reconstructions of the cervical spine were also generated. COMPARISON:  Head CT, 09/26/2013.  Cervical CT, 01/21/2013. FINDINGS: CT HEAD FINDINGS The ventricles are normal configuration. There is ventricular and sulcal enlargement reflecting moderate generalized atrophy similar to prior studies. There are no parenchymal masses or mass effect. There is no evidence of a cortical infarct. White matter hypoattenuation is noted consistent with moderate chronic microvascular ischemic change. A probable old infarct is noted extending from  the right thalamus to the right posterior limb of the internal capsule. There are no extra-axial masses or abnormal fluid collections. There is no intracranial hemorrhage. Both maxillary sinuses are chronically opacified right inspissated mucus. I walls are thickened. Areas mild ethmoid sinus mucosal thickening. Dependent mucosal thickening and some fluid is seen in both sphenoid sinuses. Mastoid air cells are clear. CT CERVICAL SPINE FINDINGS Chronic type 2 odontoid fracture, which appears stable when compared to the prior cervical CT. Odontoid is mildly displaced posteriorly by approximate 2 mm, also stable. No acute fractures. There is marked loss of disc height with partial bony fusion across the C5-C6 disc level. Mild loss of disc height at C4-C5 and C6-C7. There are facet degenerative changes bilaterally. Bones are demineralized. Soft tissues show carotid vascular calcifications but otherwise unremarkable. Lung apices are clear. IMPRESSION: HEAD CT: No acute intracranial abnormalities. Sinus disease including chronically opacified maxillary sinuses from inspissated mucus. CERVICAL CT: No acute fracture. Chronic type 2 odontoid fracture. No other fractures. Electronically Signed   By: Amie Portland M.D.   On: 01/22/2016 19:41   Ct Cervical Spine Wo Contrast  01/22/2016  CLINICAL DATA:  Pt from [facility], was walking with walker and fell backwards. Pt c/o lower back pain and has lac to left hand b/w pinky and ring finger. EXAM: CT HEAD WITHOUT CONTRAST CT CERVICAL SPINE WITHOUT CONTRAST TECHNIQUE: Multidetector CT imaging of the head and cervical spine was performed following the standard protocol without intravenous contrast. Multiplanar CT image reconstructions of the cervical spine were also generated. COMPARISON:  Head CT, 09/26/2013.  Cervical CT, 01/21/2013. FINDINGS: CT HEAD FINDINGS The ventricles are normal configuration. There is ventricular and sulcal enlargement reflecting moderate  generalized atrophy similar to prior studies. There are no parenchymal masses  or mass effect. There is no evidence of a cortical infarct. White matter hypoattenuation is noted consistent with moderate chronic microvascular ischemic change. A probable old infarct is noted extending from the right thalamus to the right posterior limb of the internal capsule. There are no extra-axial masses or abnormal fluid collections. There is no intracranial hemorrhage. Both maxillary sinuses are chronically opacified right inspissated mucus. I walls are thickened. Areas mild ethmoid sinus mucosal thickening. Dependent mucosal thickening and some fluid is seen in both sphenoid sinuses. Mastoid air cells are clear. CT CERVICAL SPINE FINDINGS Chronic type 2 odontoid fracture, which appears stable when compared to the prior cervical CT. Odontoid is mildly displaced posteriorly by approximate 2 mm, also stable. No acute fractures. There is marked loss of disc height with partial bony fusion across the C5-C6 disc level. Mild loss of disc height at C4-C5 and C6-C7. There are facet degenerative changes bilaterally. Bones are demineralized. Soft tissues show carotid vascular calcifications but otherwise unremarkable. Lung apices are clear. IMPRESSION: HEAD CT: No acute intracranial abnormalities. Sinus disease including chronically opacified maxillary sinuses from inspissated mucus. CERVICAL CT: No acute fracture. Chronic type 2 odontoid fracture. No other fractures. Electronically Signed   By: Amie Portland M.D.   On: 01/22/2016 19:41    I have personally reviewed and evaluated these images and lab results as part of my medical decision-making.   MDM   Final diagnoses:  Fall, initial encounter  Skin tear of hand without complication, right, initial encounter  Abrasion of scalp, initial encounter    Patient with mechanical fall from wheelchair. Has an abrasion on his left forehead, skin tear to right hand, no other obvious  injuries. CTs are negative. Discharge to home. Patient seen by and discussed with Dr. Silverio Lay, who agrees with the plan.    Roxy Horseman, PA-C 01/22/16 2004  Richardean Canal, MD 01/22/16 2049

## 2016-01-23 NOTE — ED Notes (Signed)
Patient had a wet and stool diaper. Patient cleaned up. Sent Shirt and pants in a belongs bag with PTAR. Placed patient in a gown and wrapped him up in blankets.

## 2016-03-14 ENCOUNTER — Emergency Department (HOSPITAL_COMMUNITY)

## 2016-03-14 ENCOUNTER — Emergency Department (HOSPITAL_COMMUNITY)
Admission: EM | Admit: 2016-03-14 | Discharge: 2016-03-14 | Disposition: A | Discharge status: Home / Self Care | Attending: Emergency Medicine | Admitting: Emergency Medicine

## 2016-03-14 ENCOUNTER — Encounter (HOSPITAL_COMMUNITY): Payer: Self-pay | Admitting: Emergency Medicine

## 2016-03-14 DIAGNOSIS — Y998 Other external cause status: Secondary | ICD-10-CM | POA: Diagnosis not present

## 2016-03-14 DIAGNOSIS — S0191XA Laceration without foreign body of unspecified part of head, initial encounter: Secondary | ICD-10-CM | POA: Insufficient documentation

## 2016-03-14 DIAGNOSIS — Z23 Encounter for immunization: Secondary | ICD-10-CM | POA: Diagnosis not present

## 2016-03-14 DIAGNOSIS — Y9289 Other specified places as the place of occurrence of the external cause: Secondary | ICD-10-CM | POA: Insufficient documentation

## 2016-03-14 DIAGNOSIS — S12100A Unspecified displaced fracture of second cervical vertebra, initial encounter for closed fracture: Secondary | ICD-10-CM | POA: Diagnosis not present

## 2016-03-14 DIAGNOSIS — S0990XA Unspecified injury of head, initial encounter: Secondary | ICD-10-CM | POA: Diagnosis present

## 2016-03-14 DIAGNOSIS — F0390 Unspecified dementia without behavioral disturbance: Secondary | ICD-10-CM | POA: Insufficient documentation

## 2016-03-14 DIAGNOSIS — D649 Anemia, unspecified: Secondary | ICD-10-CM | POA: Diagnosis not present

## 2016-03-14 DIAGNOSIS — Y9389 Activity, other specified: Secondary | ICD-10-CM | POA: Diagnosis not present

## 2016-03-14 DIAGNOSIS — N289 Disorder of kidney and ureter, unspecified: Secondary | ICD-10-CM | POA: Diagnosis not present

## 2016-03-14 DIAGNOSIS — S81011A Laceration without foreign body, right knee, initial encounter: Secondary | ICD-10-CM | POA: Diagnosis not present

## 2016-03-14 DIAGNOSIS — S51012A Laceration without foreign body of left elbow, initial encounter: Secondary | ICD-10-CM | POA: Diagnosis not present

## 2016-03-14 DIAGNOSIS — W19XXXA Unspecified fall, initial encounter: Secondary | ICD-10-CM

## 2016-03-14 HISTORY — DX: Unspecified dementia, unspecified severity, without behavioral disturbance, psychotic disturbance, mood disturbance, and anxiety: F03.90

## 2016-03-14 LAB — BASIC METABOLIC PANEL
Anion gap: 10 (ref 5–15)
BUN: 52 mg/dL — ABNORMAL HIGH (ref 6–20)
CO2: 22 mmol/L (ref 22–32)
Calcium: 8.7 mg/dL — ABNORMAL LOW (ref 8.9–10.3)
Chloride: 112 mmol/L — ABNORMAL HIGH (ref 101–111)
Creatinine, Ser: 3.12 mg/dL — ABNORMAL HIGH (ref 0.61–1.24)
GFR calc Af Amer: 18 mL/min — ABNORMAL LOW (ref >60)
GFR calc non Af Amer: 16 mL/min — ABNORMAL LOW (ref >60)
Glucose, Bld: 128 mg/dL — ABNORMAL HIGH (ref 65–99)
Potassium: 4.3 mmol/L (ref 3.5–5.1)
Sodium: 144 mmol/L (ref 135–145)

## 2016-03-14 LAB — CBC WITH DIFFERENTIAL/PLATELET
Basophils Absolute: 0 10*3/uL (ref 0.0–0.1)
Basophils Relative: 0 %
Eosinophils Absolute: 0.2 10*3/uL (ref 0.0–0.7)
Eosinophils Relative: 2 %
HCT: 28.2 % — ABNORMAL LOW (ref 39.0–52.0)
Hemoglobin: 8.6 g/dL — ABNORMAL LOW (ref 13.0–17.0)
Lymphocytes Relative: 36 %
Lymphs Abs: 3.2 10*3/uL (ref 0.7–4.0)
MCH: 26.9 pg (ref 26.0–34.0)
MCHC: 30.5 g/dL (ref 30.0–36.0)
MCV: 88.1 fL (ref 78.0–100.0)
Monocytes Absolute: 0.6 10*3/uL (ref 0.1–1.0)
Monocytes Relative: 7 %
Neutro Abs: 4.8 10*3/uL (ref 1.7–7.7)
Neutrophils Relative %: 55 %
Platelets: 316 10*3/uL (ref 150–400)
RBC: 3.2 MIL/uL — ABNORMAL LOW (ref 4.22–5.81)
RDW: 13.7 % (ref 11.5–15.5)
WBC: 8.8 10*3/uL (ref 4.0–10.5)

## 2016-03-14 LAB — PROTIME-INR
INR: 1.27 (ref 0.00–1.49)
PROTHROMBIN TIME: 16.1 s — AB (ref 11.6–15.2)

## 2016-03-14 MED ORDER — SODIUM CHLORIDE 0.9 % IV BOLUS (SEPSIS)
500.0000 mL | Freq: Once | INTRAVENOUS | Status: AC
  Administered 2016-03-14: 500 mL via INTRAVENOUS

## 2016-03-14 MED ORDER — LIDOCAINE-EPINEPHRINE (PF) 2 %-1:200000 IJ SOLN
10.0000 mL | Freq: Once | INTRAMUSCULAR | Status: AC
  Administered 2016-03-14: 10 mL
  Filled 2016-03-14: qty 20

## 2016-03-14 MED ORDER — TETANUS-DIPHTH-ACELL PERTUSSIS 5-2.5-18.5 LF-MCG/0.5 IM SUSP
0.5000 mL | Freq: Once | INTRAMUSCULAR | Status: AC
  Administered 2016-03-14: 0.5 mL via INTRAMUSCULAR
  Filled 2016-03-14: qty 0.5

## 2016-03-14 NOTE — Discharge Instructions (Signed)
You need to wear neck collar at all times.   Your kidneys function are abnormal and you are anemic. You should talk to your doctor about it.   See neurosurgery and your doctor.   Suture removal in a week.   Return to ER if you have severe pain, weakness, unable to walk, vomiting, fever.

## 2016-03-14 NOTE — ED Notes (Signed)
Family at bedside. 

## 2016-03-14 NOTE — ED Provider Notes (Signed)
  Physical Exam  BP 134/58 mmHg  Pulse 87  Temp(Src) 96 F (35.6 C) (Axillary)  Resp 14  Ht 5\' 8"  (1.727 m)  Wt 65 kg  BMI 21.79 kg/m2  SpO2 100%  Physical Exam  ED Course  Procedures  Has odontoid fracture from mechanical fall. Dispo per [facility] he needs collar forever. Needs Hospice consult, looking for placement directly from ED. Social work is still working on this.    social worker reports that they have arranged for the patient to go to a higher level of care, still on hospice status, he will be discharged back to his facility     Erskine Emeryhris Joselinne Lawal, MD 03/15/16 0049  Richardean Canalavid H Yao, MD 03/17/16 [phone]

## 2016-03-14 NOTE — ED Notes (Signed)
PTAR 

## 2016-03-14 NOTE — ED Provider Notes (Signed)
CSN: [id number]     Arrival date & time 03/14/16  1221 History   First MD Initiated Contact with Patient 03/14/16 1237     Chief Complaint  Patient presents with  . Trauma     (Consider location/radiation/quality/duration/timing/severity/associated sxs/prior Treatment) HPI 82 y.o. male With a history of severe dementia with multiple falls presents to the ED from his memory care assisted living facility after he was found to have a fall from his wheelchair landing headfirst on the floor beneath him. Positive LOC with diminished GCS of 10 on arrival by EMS after a fall. Reportedly at baseline the patient is able to speak and follow commands but is unable to perform ADLs. Patient is wheelchair-bound secondary to prior fall sustaining a pelvic fracture. On arrival the patient is GCS of 11-12, responsive to voice and pain, moving all extremities equally in the setting of chronic contracture of his left upper extremity and left lower extremity. No reported seizure-like activity nor history thereof. The patient is on no anticoagulant medications. He follows with hospice and has DO NOT RESUSCITATE in place. The patient is unable to contribute to his history due to dementia and head injury, and history is obtained by EMS providers as well as his son who arrived to the ED later.   Past Medical History  Diagnosis Date  . Dementia    No past surgical history on file. No family history on file. Social History  Substance Use Topics  . Smoking status: Not on file  . Smokeless tobacco: Not on file  . Alcohol Use: Not on file    Review of Systems  Unable to perform ROS: Dementia      Allergies  Review of patient's allergies indicates not on file.  Home Medications   Prior to Admission medications   Not on File   BP 134/58 mmHg  Pulse 87  Temp(Src) 96 F (35.6 C) (Axillary)  Resp 14  Ht  (1.727 m)  Wt 65 kg  BMI 21.79 kg/m2  SpO2 100% Physical Exam  Constitutional: He appears  lethargic. He appears cachectic. He is easily aroused. No distress. Cervical collar in place.  HENT:  Head: Head is with laceration. Head is without raccoon's eyes and without Battle's sign.    Nose: Nose normal.  Mouth/Throat: Oropharynx is clear and moist.  Eyes: Conjunctivae and EOM are normal. Pupils are equal, round, and reactive to light.  Cardiovascular: Normal rate, regular rhythm, normal heart sounds and intact distal pulses.   Pulmonary/Chest: Effort normal and breath sounds normal.  Abdominal: Soft. He exhibits no distension. There is no tenderness.  Musculoskeletal: He exhibits no edema or tenderness.  Neurological: He is easily aroused. He appears lethargic. He exhibits abnormal muscle tone. He displays no seizure activity. GCS eye subscore is 3. GCS verbal subscore is 4. GCS motor subscore is 4.  Chronic contracture of LUE and LLE with elbow and knee chronically bent.  MAE equally to painful stimuli.  Will awake and respond to voice and painful stimuli.   Skin: Skin is warm and dry. He is not diaphoretic.     Skin tears on left elbow and right knee. Healing skin tear on left knee.  Nursing note and vitals reviewed.   ED Course  Procedures (including critical care time) Labs Review Labs Reviewed  CBC WITH DIFFERENTIAL/PLATELET - Abnormal; Notable for the following:    RBC 3.20 (*)    Hemoglobin 8.6 (*)    HCT 28.2 (*)    All  other components within normal limits  BASIC METABOLIC PANEL - Abnormal; Notable for the following:    Chloride 112 (*)    Glucose, Bld 128 (*)    BUN 52 (*)    Creatinine, Ser 3.12 (*)    Calcium 8.7 (*)    GFR calc non Af Amer 16 (*)    GFR calc Af Amer 18 (*)    All other components within normal limits  PROTIME-INR - Abnormal; Notable for the following:    Prothrombin Time 16.1 (*)    All other components within normal limits    Imaging Review Ct Head Wo Contrast  03/14/2016  CLINICAL DATA:  Larey SeatFell out of wheelchair. Unresponsive for  several minutes. EXAM: CT HEAD WITHOUT CONTRAST CT CERVICAL SPINE WITHOUT CONTRAST TECHNIQUE: Multidetector CT imaging of the head and cervical spine was performed following the standard protocol without intravenous contrast. Multiplanar CT image reconstructions of the cervical spine were also generated. COMPARISON:  None. FINDINGS: CT HEAD FINDINGS There is atrophy and chronic small vessel disease changes. No acute intracranial abnormality. Specifically, no hemorrhage, hydrocephalus, mass lesion, acute infarction, or significant intracranial injury. No acute calvarial abnormality. CT CERVICAL SPINE FINDINGS There is a mildly displaced odontoid fracture. The fracture line in the underlying C2 vertebral body appears corticated suggesting this is subacute to chronic. The odontoid is displaced posteriorly approximately 4 mm. The lateral masses of C1 are displaced posteriorly approximately 6-7 mm. There is diffuse facet disease and degenerative disc disease. Prevertebral soft tissues are normal. Partial fusion across the C5-6 and C6-7 disc spaces. Slight anterolisthesis of C7 on T1 related to facet disease. IMPRESSION: No acute intracranial abnormality. Atrophy, chronic microvascular disease. Subacute to chronic odontoid fracture with posterior displacement as described above. This should be treated as an unstable injury. Diffuse cervical spondylosis. Critical Value/emergent results were called by telephone at the time of interpretation on 03/14/2016 at 1:52 pm to Dr. Rhoderick MoodyWARREN Nigil Braman , who verbally acknowledged these results. Electronically Signed   By: Charlett NoseKevin  Dover M.D.   On: 03/14/2016 13:52   Ct Cervical Spine Wo Contrast  03/14/2016  CLINICAL DATA:  Larey SeatFell out of wheelchair. Unresponsive for several minutes. EXAM: CT HEAD WITHOUT CONTRAST CT CERVICAL SPINE WITHOUT CONTRAST TECHNIQUE: Multidetector CT imaging of the head and cervical spine was performed following the standard protocol without intravenous contrast.  Multiplanar CT image reconstructions of the cervical spine were also generated. COMPARISON:  None. FINDINGS: CT HEAD FINDINGS There is atrophy and chronic small vessel disease changes. No acute intracranial abnormality. Specifically, no hemorrhage, hydrocephalus, mass lesion, acute infarction, or significant intracranial injury. No acute calvarial abnormality. CT CERVICAL SPINE FINDINGS There is a mildly displaced odontoid fracture. The fracture line in the underlying C2 vertebral body appears corticated suggesting this is subacute to chronic. The odontoid is displaced posteriorly approximately 4 mm. The lateral masses of C1 are displaced posteriorly approximately 6-7 mm. There is diffuse facet disease and degenerative disc disease. Prevertebral soft tissues are normal. Partial fusion across the C5-6 and C6-7 disc spaces. Slight anterolisthesis of C7 on T1 related to facet disease. IMPRESSION: No acute intracranial abnormality. Atrophy, chronic microvascular disease. Subacute to chronic odontoid fracture with posterior displacement as described above. This should be treated as an unstable injury. Diffuse cervical spondylosis. Critical Value/emergent results were called by telephone at the time of interpretation on 03/14/2016 at 1:52 pm to Dr. Rhoderick MoodyWARREN Kashton Mcartor , who verbally acknowledged these results. Electronically Signed   By: Charlett NoseKevin  Dover M.D.   On: 03/14/2016  13:52   I have personally reviewed and evaluated these images and lab results as part of my medical decision-making.   EKG Interpretation None      MDM  82 y.o. male with a hx of dementia on hospice presents to the ED after a fall from his wheelchair striking his head on the floor below. + LOC and he has since been with a reportedly decreased GCS from his baseline. The patient remained hemodynamically stable while in route to the hospital. On arrival the patient is GCS of 11 with laceration to his scalp, as above as well as skin tears to his left arm  and right knee. CT scans were done and showed no acute intracranial abnormality but showed evidence of acute versus subacute odontoid fracture with posterior displacement which is felt to be unstable. This result was discussed with the radiologist. The patient's EMS cervical collar was switched out with a Aspen collar. His case was discussed with neurosurgery who recommended continued C collar likely for the remainder of the patient's life, as the patient is not an operative candidate. Labs are drawn and showed evidence of creatinine of 3.12. His case was discussed with the hospitalist as the patient likely need placement into a higher level of care than his facility can provide currently. He recommended social work and case management consultation to further determine his ability to be placed, possibly directly from the ED. His head laceration was sutured by Dr. Juleen China at the bedside. While awaiting social work/case management evaluation his care was signed out to Dr. Earlene Plater. Please see their note for further information regarding the remainder of his care in the ED and his disposition.   Final diagnoses:  Fall  Laceration of head, initial encounter       Francoise Ceo, DO 03/14/16 1731  Raeford Razor, MD 03/20/16 2136

## 2016-03-14 NOTE — ED Notes (Signed)
Patient transported to CT 

## 2016-03-14 NOTE — ED Notes (Signed)
Placed saline soaked 4X4's on pt's skin tears and wrapped with stretch bandages.

## 2016-03-14 NOTE — ED Provider Notes (Signed)
I saw and evaluated the patient, reviewed the resident's note and I agree with the findings and plan.   EKG Interpretation None      89yM with fall from wheelchair. Hx from EMS. Witnessed fall. Larey SeatFell forward and struck head. Reported LOC. Decreased mental status on EMS arrival. Has baseline dementia. EMS reports that was initially was not responding verbally. Would moan to pain and withdrawal extremities to pain. L sided contractures reportedly baseline. No vomiting. No blood thinners.   On exam he is a frail/elderly gentleman. He does answer to voice and follows simple commands such as as wiggle feet andsqueeze fingers with R hand. L sided contractures. Laceration to forehead. No active bleeding. Multiple skins tears to elbows and knees some of which appear acute and others subacute. No apparent midline spinal spinal tenderness. No apparent bony tenderness of extremities or apparent pain with ROM. Abdomen soft and NT. No distension. Surgical scar noted. Reducible umbilical hernia.   Imaging with subacute to chronic odontoid fx. Discussed with neurosurgery. Hard collar. Renal impairment with unclear baseline. Multiple recent falls and likely needs ultimately likely needs placed in higher level of care. Suture forehead laceration.   Raeford RazorStephen Encarnacion Bole, MD 03/14/16 [phone]

## 2016-03-14 NOTE — ED Notes (Signed)
Pt removing bandages from skin tears on left knee area.

## 2016-03-14 NOTE — ED Notes (Signed)
[facility] from [facility], fell from wheel chair and was unresponsiveness, arrived as a level 2 trauma with mildly improving mental status, VSS, lead lac and multiple skin tears  DNR

## 2016-03-14 NOTE — Progress Notes (Signed)
Orthopedic Tech Progress Note Patient Details:  [name] 1919-09-24 [id number]  Patient ID: Dean MoteGeorge Owen, male   DOB: 1919-09-24, 82 y.o.   MRN: [id number] Made level 2 trauma visit  Nikki DomCrawford, Avan Gullett 03/14/2016, 1:01 PM

## 2016-03-14 NOTE — ED Notes (Signed)
Pts gown and brief were changed. 

## 2016-03-14 NOTE — Progress Notes (Signed)
Patient ID: Dean MoteGeorge Owen, male   DOB: 1919/01/29, 82 y.o.   MRN: [id number] I was called to review the CT scan  Of this 82 year old with end-stage dementia who fell out of his wheelchair and suffered a C2 fx. Apparently he is DO NOT RESUSCITATE and hospice care.  CT scan shows a type II odontoid fracture which is minimally posteriorly displaced. Canal looks open.  Obviously he is not a surgical candidate. This should be treated with external  Immobilization with either a soft cervical collar or a semirigid collar (aspen). He will likely have to wear this for life as he is not a surgical candidate and This will not likely heal. Please let me know if I can be of further assistance.

## 2016-03-14 NOTE — ED Notes (Signed)
Back from CT

## 2016-03-14 NOTE — ED Notes (Signed)
Patient denies pain and is resting comfortably.  

## 2016-03-15 ENCOUNTER — Encounter (HOSPITAL_COMMUNITY): Payer: Self-pay | Admitting: Emergency Medicine

## 2016-04-21 DEATH — deceased

## 2017-09-03 IMAGING — CT CT CERVICAL SPINE W/O CM
3 of 5 series · 13 of 33 positions shown, 16 images · non-contrast
Comparison: None.

CLINICAL DATA: Fell out of wheelchair. Unresponsive for several
minutes.

EXAM:
CT HEAD WITHOUT CONTRAST
CT CERVICAL SPINE WITHOUT CONTRAST
TECHNIQUE: Multidetector CT imaging of the head and cervical spine was
performed following the standard protocol without intravenous
contrast. Multiplanar CT image reconstructions of the cervical spine
were also generated.

[Series 6: c_spine 2.0 i30s 3 · axial · 0.52mm/px · z∈[-240,-130]mm · 5 of 83 slices shown, 7 images]
[im 14/83  soft-tissue]
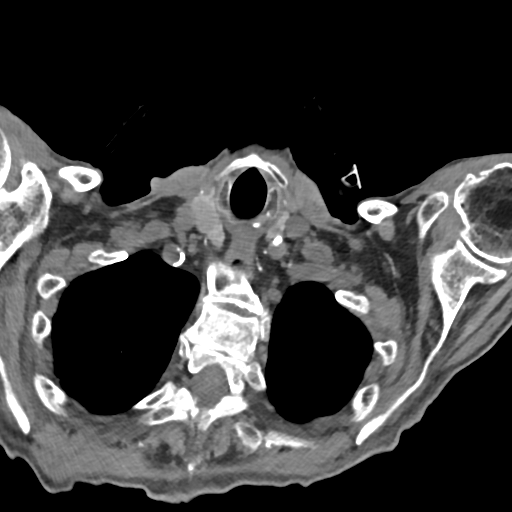
[im 14/83  bone]
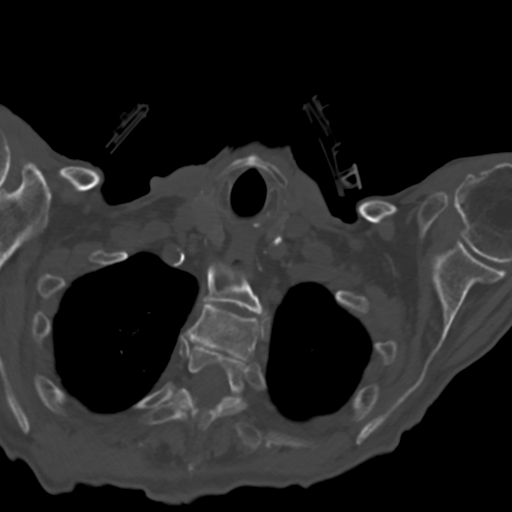
[im 28/83  bone]
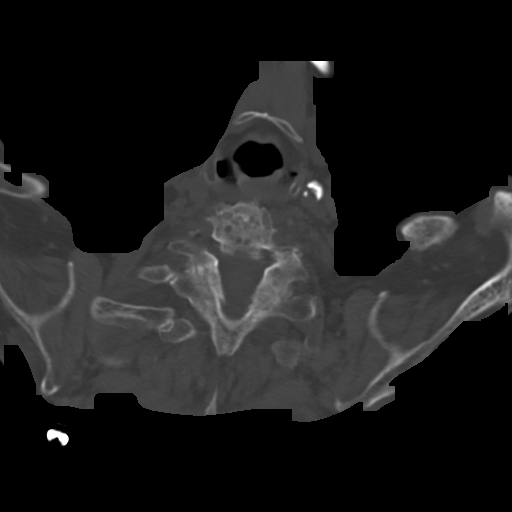
[im 42/83  bone]
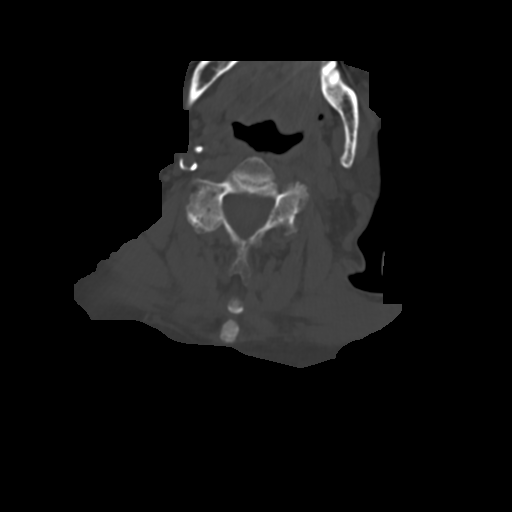
[im 55/83  bone]
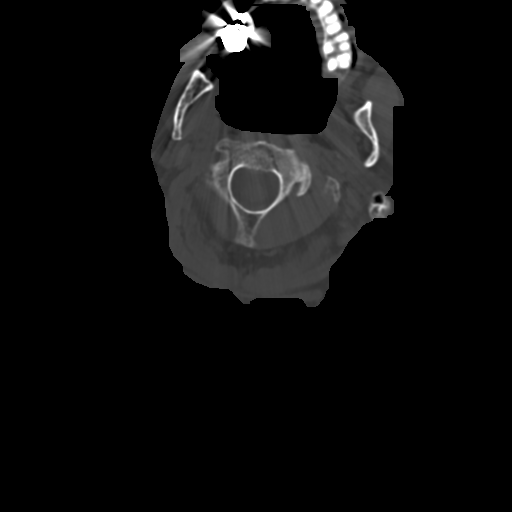
[im 69/83  soft-tissue]
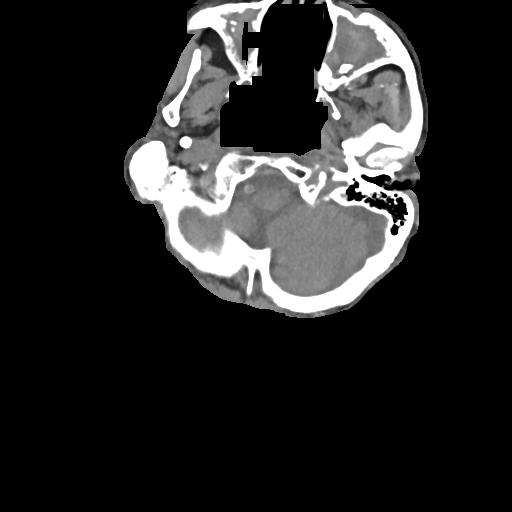
[im 69/83  bone]
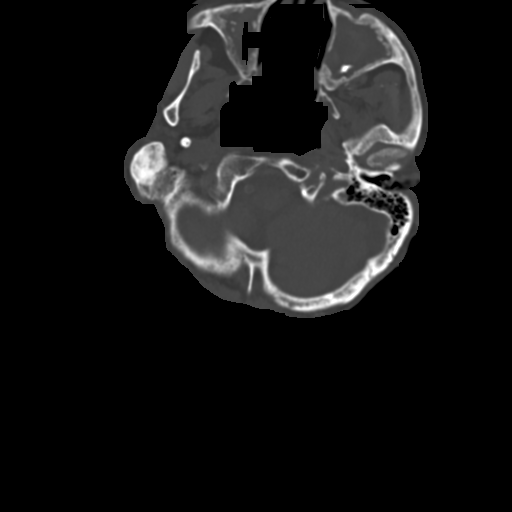

[Series 8: coronals · coronal · 0.23mm/px · 3 of 56 slices shown]
[im 12/56  bone]
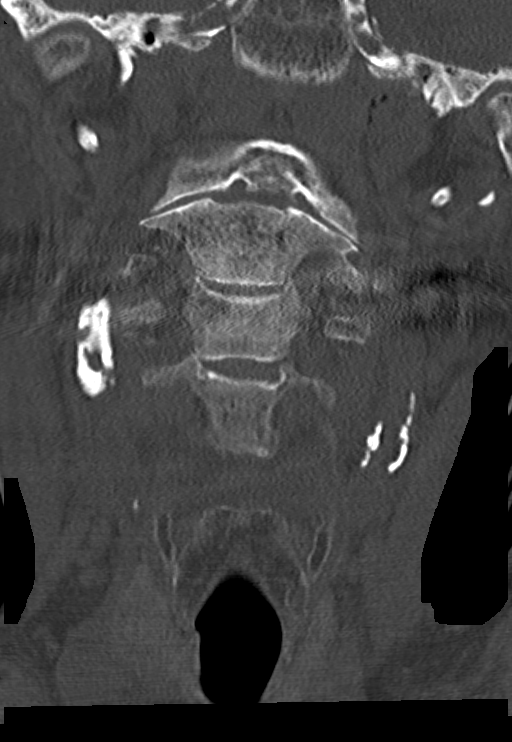
[im 23/56  bone]
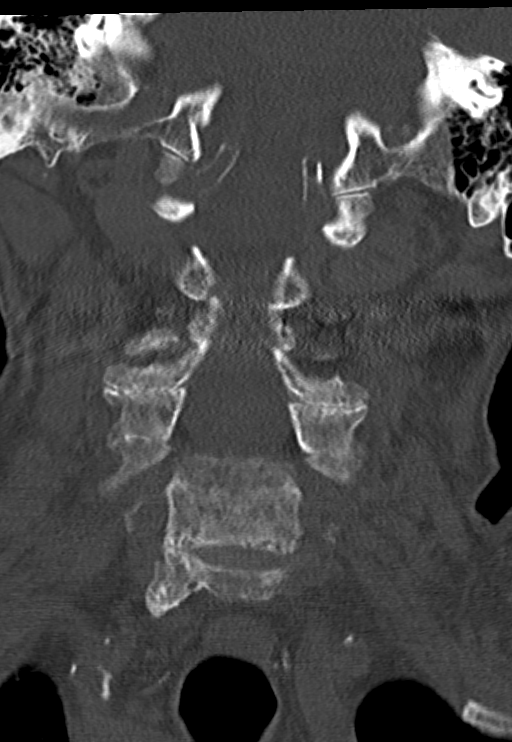
[im 34/56  bone]
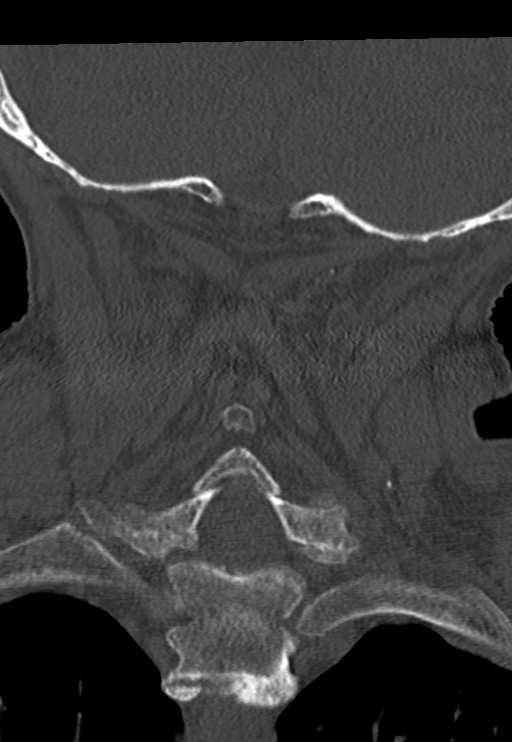

[Series 10: sagittals · sagittal · 0.26mm/px · 5 of 54 slices shown, 6 images]
[im 18/54  bone]
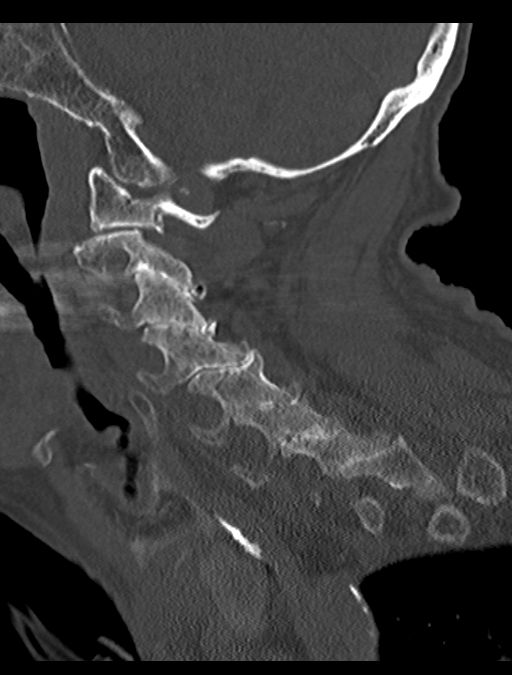
[im 23/54  bone]
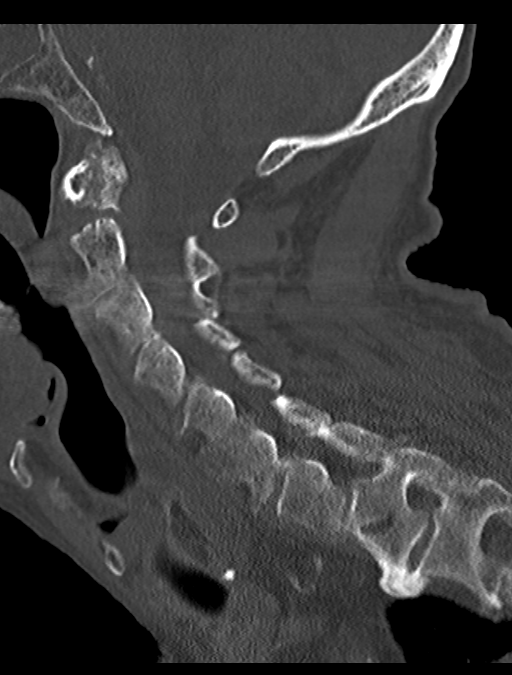
[im 27/54  soft-tissue]
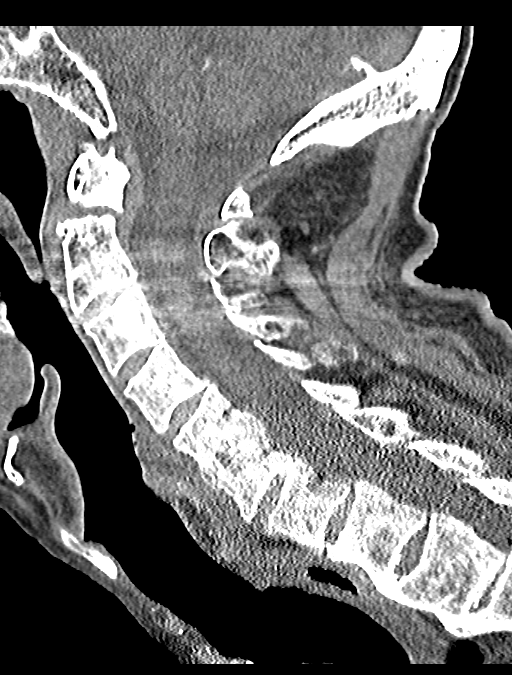
[im 27/54  bone]
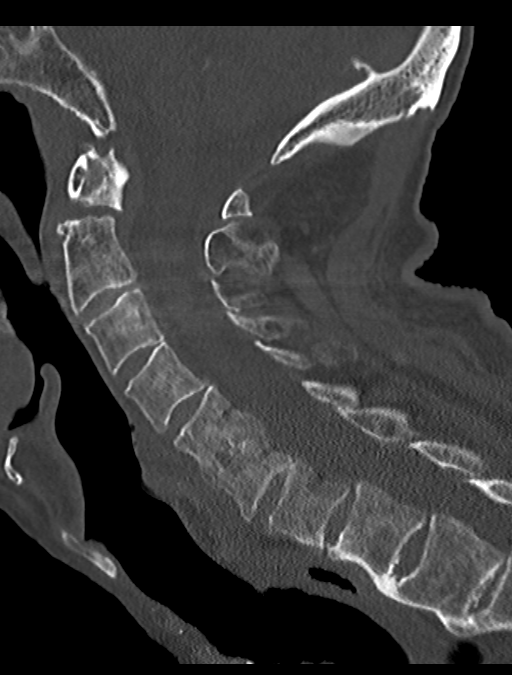
[im 31/54  bone]
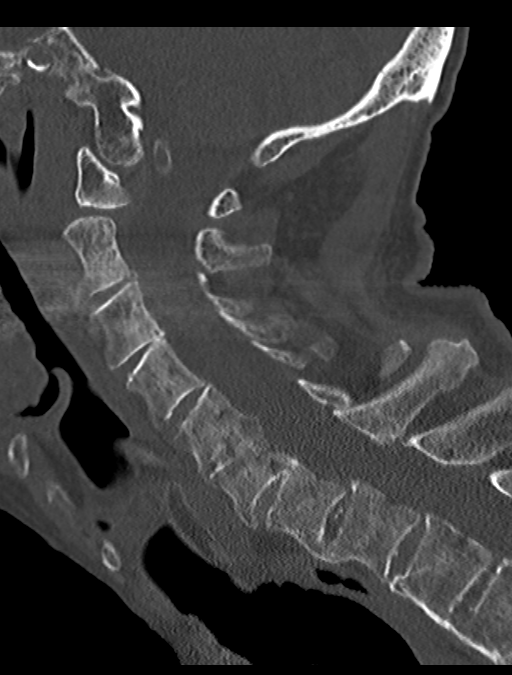
[im 36/54  bone]
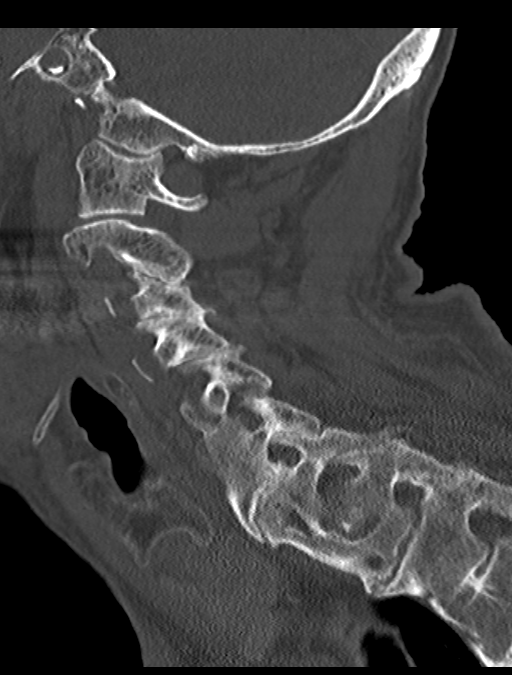

[13 of 33 positions shown; findings below may reference images not displayed]

FINDINGS: CT HEAD FINDINGS

There is atrophy and chronic small vessel disease changes. No acute
intracranial abnormality. Specifically, no hemorrhage,
hydrocephalus, mass lesion, acute infarction, or significant
intracranial injury. No acute calvarial abnormality.

CT CERVICAL SPINE FINDINGS

There is a mildly displaced odontoid fracture. The fracture line in
the underlying C2 vertebral body appears corticated suggesting this
is subacute to chronic. The odontoid is displaced posteriorly
approximately 4 mm. The lateral masses of C1 are displaced
posteriorly approximately 6-7 mm. There is diffuse facet disease and
degenerative disc disease. Prevertebral soft tissues are normal.
Partial fusion across the C5-6 and C6-7 disc spaces. Slight
anterolisthesis of C7 on T1 related to facet disease.
IMPRESSION: No acute intracranial abnormality.

Atrophy, chronic microvascular disease.

Subacute to chronic odontoid fracture with posterior displacement as
described above. This should be treated as an unstable injury.

Diffuse cervical spondylosis.

Critical Value/emergent results were called by telephone at the time
of interpretation on 03/14/2016 at [DATE] to Dr. BLEINER PT , who
verbally acknowledged these results.
